# Patient Record
Sex: Female | Born: 1955 | Race: White | Hispanic: No | Marital: Married | Smoking: Former smoker
Health system: Southern US, Community
[De-identification: ages and names within clinical notes are randomized; demographics above are authoritative.]

## PROBLEM LIST (undated history)

## (undated) DIAGNOSIS — E109 Type 1 diabetes mellitus without complications: Secondary | ICD-10-CM

## (undated) DIAGNOSIS — J302 Other seasonal allergic rhinitis: Secondary | ICD-10-CM

## (undated) DIAGNOSIS — M329 Systemic lupus erythematosus, unspecified: Secondary | ICD-10-CM

## (undated) DIAGNOSIS — E039 Hypothyroidism, unspecified: Secondary | ICD-10-CM

## (undated) DIAGNOSIS — E785 Hyperlipidemia, unspecified: Secondary | ICD-10-CM

## (undated) DIAGNOSIS — I10 Essential (primary) hypertension: Secondary | ICD-10-CM

## (undated) DIAGNOSIS — K219 Gastro-esophageal reflux disease without esophagitis: Secondary | ICD-10-CM

## (undated) DIAGNOSIS — K589 Irritable bowel syndrome without diarrhea: Secondary | ICD-10-CM

## (undated) DIAGNOSIS — E119 Type 2 diabetes mellitus without complications: Secondary | ICD-10-CM

## (undated) HISTORY — DX: Hyperlipidemia, unspecified: E78.5

## (undated) HISTORY — DX: Gastro-esophageal reflux disease without esophagitis: K21.9

## (undated) HISTORY — DX: Irritable bowel syndrome without diarrhea: K58.9

## (undated) HISTORY — DX: Systemic lupus erythematosus, unspecified: M32.9

## (undated) HISTORY — DX: Type 1 diabetes mellitus without complications: E10.9

## (undated) HISTORY — DX: Other seasonal allergic rhinitis: J30.2

## (undated) HISTORY — DX: Hypothyroidism, unspecified: E03.9

## (undated) HISTORY — DX: Essential (primary) hypertension: I10

## (undated) HISTORY — DX: Type 2 diabetes mellitus without complications: E11.9

---

## 2000-04-03 ENCOUNTER — Inpatient Hospital Stay
Admission: EM | Admit: 2000-04-03 | Discharge status: Against Medical Advice | Payer: Self-pay | Source: Emergency Department | Admitting: Internal Medicine

## 2002-03-25 ENCOUNTER — Emergency Department
Admission: EM | Admit: 2002-03-25 | Disposition: A | Discharge status: Home / Self Care | Payer: Self-pay | Source: Ambulatory Visit

## 2003-07-15 ENCOUNTER — Ambulatory Visit: Admission: RE | Admit: 2003-07-15 | Payer: Self-pay | Source: Ambulatory Visit | Admitting: Gastroenterology

## 2007-05-03 ENCOUNTER — Ambulatory Visit
Admit: 2007-05-03 | Disposition: A | Discharge status: Home / Self Care | Payer: Self-pay | Source: Ambulatory Visit | Admitting: Orthopaedic Surgery

## 2007-07-20 ENCOUNTER — Ambulatory Visit
Admit: 2007-07-20 | Disposition: A | Discharge status: Home / Self Care | Payer: Self-pay | Source: Ambulatory Visit | Admitting: Orthopaedic Surgery

## 2007-07-20 LAB — CBC- CERNER
Hematocrit: 36.5 % — ABNORMAL LOW (ref 37.0–47.0)
Hgb: 12.4 G/DL (ref 12.0–16.0)
MCH: 29.6 PG (ref 28.0–32.0)
MCHC: 34 G/DL (ref 32.0–36.0)
MCV: 86.9 FL (ref 80.0–100.0)
MPV: 7.6 FL (ref 7.4–10.4)
Platelets: 344 /mm3 (ref 140–400)
RBC: 4.21 /mm3 (ref 4.20–5.40)
RDW: 13.3 % (ref 11.5–15.0)
WBC: 6.2 /mm3 (ref 3.5–10.8)

## 2007-07-20 LAB — BASIC METABOLIC PANEL
BUN: 18 MG/DL (ref 7–21)
CO2: 28 MEQ/L (ref 22–31)
Calcium: 9.5 MG/DL (ref 8.6–10.2)
Chloride: 104 MEQ/L (ref 98–107)
Creatinine: 0.7 MG/DL (ref 0.5–1.4)
Glucose: 68 MG/DL (ref 65–110)
Potassium: 4.8 MEQ/L (ref 3.6–5.0)
Sodium: 140 MEQ/L (ref 136–143)

## 2007-07-20 LAB — GFR

## 2007-07-31 ENCOUNTER — Ambulatory Visit: Admission: RE | Admit: 2007-07-31 | Payer: Self-pay | Source: Ambulatory Visit | Admitting: Orthopaedic Surgery

## 2008-01-13 ENCOUNTER — Emergency Department
Admission: EM | Admit: 2008-01-13 | Discharge status: Against Medical Advice | Payer: Self-pay | Source: Ambulatory Visit

## 2008-01-14 ENCOUNTER — Emergency Department
Admission: EM | Admit: 2008-01-14 | Disposition: A | Discharge status: Home / Self Care | Payer: Self-pay | Source: Ambulatory Visit

## 2009-03-14 ENCOUNTER — Ambulatory Visit
Admission: RE | Admit: 2009-03-14 | Disposition: A | Discharge status: Home / Self Care | Payer: Self-pay | Source: Ambulatory Visit | Admitting: Specialist

## 2010-11-30 ENCOUNTER — Ambulatory Visit: Payer: Self-pay

## 2010-11-30 ENCOUNTER — Ambulatory Visit: Admission: RE | Admit: 2010-11-30 | Payer: Self-pay | Source: Ambulatory Visit | Admitting: Surgery

## 2010-12-01 LAB — LAB USE ONLY - HISTORICAL SURGICAL PATHOLOGY

## 2011-05-03 ENCOUNTER — Ambulatory Visit
Admission: RE | Admit: 2011-05-03 | Discharge: 2011-05-03 | Disposition: A | Discharge status: Home / Self Care | Payer: Self-pay | Source: Ambulatory Visit | Attending: Gastroenterology | Admitting: Gastroenterology

## 2011-08-04 LAB — ECG 12-LEAD
Atrial Rate: 63 {beats}/min
P Axis: 69 degrees
P-R Interval: 160 ms
Q-T Interval: 386 ms
QRS Duration: 84 ms
QTC Calculation (Bezet): 395 ms
R Axis: 7 degrees
T Axis: 58 degrees
Ventricular Rate: 63 {beats}/min

## 2011-08-12 NOTE — Op Note (Signed)
ROOM NUMBER:                           OPS OPS 07            SURGEON:                                Cherlynn Polo, MD            DATE:                                   07/31/2007                  ASSISTANT:  Harvin Hazel, PA-C            PREOPERATIVE DIAGNOSIS:  Right knee anterior cruciate ligament tear.            POSTOPERATIVE DIAGNOSES:      1.   Right knee anterior cruciate ligament tear.      2.   Lateral meniscal tear.      3.   Chondromalacia of lateral femoral condyle.            OPERATION:      1.   Right anterior cruciate ligament reconstruction.      2.   Lateral meniscal repair.      3.   Chondromalacia of the lateral femoral condyle.            ANESTHESIA:  General.            INDICATIONS:  The patient is a 55 year old lady who had an injury to her      knee in June.  She was treated conservatively and had continued      instability.  An MRI showed that she had an ACL tear and possible medial      collateral ligament tear.  She had clinical instability despite      conservative management.  She understands risks of the surgery including      infection, nerve injury, blood clots, fracture-dislocation, laxity and      progression of arthritic changes and elected to proceed.            FINDINGS:  A complete tear of the ACL, a posterior horn lateral meniscal      tear on the red-on-red zone, intact medial malleolus, intact medial      compartment, intact patellofemoral compartment, grade 3 chondromalacia on      the small portion of the lateral femoral condyle.  The first assistant, my      assistant prepared the graft.  He also positioned the knee during the      meniscal repair and chondroplasty.            PROCEDURE:  The patient was brought to the operating room and after general      anesthesia was obtained, the patient was examined under anesthesia showing      to have pivot shift positive, anterior drawer positive for an ACL tear.  At      this point, the patient was prepped and draped in  a sterile fashion.            Two scope port holes were made on either side of the patellar tendon and      one  along the joint line using an #11 blade knife.  An outflow tract was      established in the medial suprapatellar pouch.  The scope was introduced      into the patellofemoral joint.  The patellofemoral joint showed normal      tracking.  The medial compartment was then entered showing intact medial      meniscus and articular surfaces.  The intracondylar notch had a      full-thickness ACL tear with an intact PCL. Lateral meniscus showed a      posterior horn lateral meniscal tear on the red-on-red zone vertical tear.      At this point, it was shaved back but still remained stable.  Bleeding      tissue was obtained at this point using a FasT-fix suture repair system.  A      FasT-fix anchor was placed and a horizontal mattress was placed on the      undersurface of the mesentery.  At this point, the string was tightened and      then cut.  This repaired the meniscus nicely.  At this point, the remnant      of the ACL was debrided back.  My assistant then prepared the ACL graft      first by reconstituting it appropriately and then fashioning it to fit 80 x      10 mm tunnel.  Two FiberWires were placed on either side.  During this      time, a notchplasty was performed on the lateral femoral condyle.  A 10-mm      retrograde drill was then placed in the footprint of the previous ACL.  A      skin nick was made distally and a guidepin was placed up in a retrograde      fashion into the joint.  A 10 x 30 mm socket was then drilled.  A      FiberStick was then placed through the cannulated drill guide and taken out      through the medial port hole.  The drill apparatus was then removed.  A      femoral tunnel guide was placed at approximately the 10:30 position with      7-mm guide.  A Beath needle was then placed through the lateral femoral      condyle and out the lateral aspect of the thigh.  An acorn  reamer was then      used to make a 10 x 30-mm tunnel.  At this point, all debris was removed      from the joint.  The graft was attached with the Beath needle and pulled      through the joint.  The FiberStick FiberWire was then used to pull the      suture through into the tibial tunnel.  The guidewire was placed into the      femoral tunnel and a 9 x 23 mm screw was placed.  After this was done, a      guidewire was then placed up the cannulated drill bit and the tibia and a      cannulated screwdriver was placed over the guidewire and the guidewire was      removed. The FiberStick was placed up through the cannulated screwdriver.      A 9 x 20 mm screw was placed, however, the screwdriver adhered to the screw      and had to be  removed.  A 10 x 20 mm screw was then placed in a similar      fashion and screwed into bone showing excellent purchase and excellent      tightness of the graft.  The graft was taken through a full range of motion      showing full extension and no impingement.  Vigorous irrigation was      performed.  Scoping instruments were removed.  Scoping port holes were      closed with 3-0 nylon.  A sterile dressing was placed.  The patient was      taken to the recovery room in stable condition.                                    Electronic Signing MD: Cherlynn Polo, MD            QQV:[id number]      Dictated:    07/31/2007  4:41 P      Transcribed: 08/01/2007 12:11 P      Job Number: [id number]      Document Number: [id number]            CC:  Cherlynn Polo, MD

## 2011-08-25 NOTE — Op Note (Signed)
Kellie Fuentes, Kellie Fuentes      MRN:          [id number]      Account:      [id number]      Document ID:  [id number]      Procedure Date: 11/30/2010            Admit Date: 11/30/2010            Patient Location: DISCHARGED 11/30/2010      Patient Type: A            SURGEON: Lorenza Chick MD      ASSISTANT:                  FIRST ASSISTANT:      Orvil Feil.            PREOPERATIVE DIAGNOSIS:      Lipoma, right arm.            POSTOPERATIVE DIAGNOSIS:      Lipoma, right arm.            TITLE OF PROCEDURE:      Excision of lipoma.            INDICATIONS:      This patient presented with a palpable subcutaneous nodule on her right      arm, and after preoperative evaluation and discussion, she wished to have      this removed.            FINDINGS:      A 2.5-cm lipoma.            DESCRIPTION OF PROCEDURE:      The patient was prepped and draped in the usual sterile fashion.  A      surgical pause was performed.  Marcaine 0.25% was used for local      anesthesia.  Antibiotics and SCDs were not indicated.  An incision was made      in the skin line over the nodule located on the right upper arm.  This was      taken down sharply and the lipoma was identified.  This was dissected out      bluntly and removed.  It measured approximately 2.5 cm.  After ensuring      satisfactory hemostasis, the wound was closed with interrupted 3-0 Vicryl.      Benzoin and Steri-Strips were applied.  The patient tolerated the procedure      well and was taken to the recovery room in stable condition.                        Electronic Signing Provider            D:  11/30/2010 08:44 AM by Dr. Lorenza Chick, MD [phone])                                   Page 1 of 2      VIVIKA, POYTHRESS      MRN:          [id number]      Account:      [id number]      Document ID:  [id number]      Procedure Date: 11/30/2010            T:  11/30/2010 12:39 PM by [id number]  cc:                                   Page 2 of 2      Authenticated by Lorenza Chick, MD [phone]) On 12/14/2010 10:29:54 AM

## 2012-02-23 ENCOUNTER — Other Ambulatory Visit: Payer: Self-pay | Admitting: Rheumatology

## 2012-02-23 ENCOUNTER — Ambulatory Visit
Admission: RE | Admit: 2012-02-23 | Discharge: 2012-02-23 | Disposition: A | Discharge status: Home / Self Care | Payer: Enrolled Prime—HMO | Source: Ambulatory Visit | Attending: Rheumatology | Admitting: Rheumatology

## 2012-02-23 DIAGNOSIS — M359 Systemic involvement of connective tissue, unspecified: Secondary | ICD-10-CM | POA: Insufficient documentation

## 2012-10-17 NOTE — Op Note (Unsigned)
DATE OF BIRTH:                        [birth year]      ADMISSION DATE:                     07/15/2003            PATIENT LOCATION:                     END END 01            DATE OF PROCEDURE:                   07/15/2003      SURGEON:                            Rayna Sexton, MD      ASSISTANT(S):                  PREOPERATIVE DIAGNOSIS:  CHANGE IN BOWEL HABITS AND RECTAL BLEEDING.            POSTOPERATIVE DIAGNOSES      1.   DIMINUTIVE COLON POLYPS.      2.   DIVERTICULOSIS COLI.            PROCEDURE:  COLONOSCOPY WITH BIOPSY.            DESCRIPTION OF PROCEDURE:  The procedure of colonoscopy and the possible      risks of bleeding and perforation requiring surgery and hospitalization had      been  discussed with the patient in my office and an informed consent was      signed.            Conscious sedation was administered with fentanyl 150 mcg IV and Versed 8      mg IV in divided doses and the patient was carefully monitored throughout      the procedure.  The Olympus video colonoscope was inserted into the rectum      and advanced without difficulty to the cecum which was documented by      identification of both the appendiceal orifice and the ileocecal valve.      Preparation of the colon was good.  The scope was slowly withdrawn from the      colon, the inner lining was well examined.  It was normal to the left colon      where a few scattered diverticula sac openings were noted.  In the rectum,      was a small size polyp removed with biopsy forceps and sent for pathology.      No significant hemorrhoids were seen.  She tolerated the procedure well,      was sent to the recovery room in good condition.                                    ___________________________________          Date Signed: __________      Rayna Sexton, MD  ([id number])            D: 07/15/2003 by Rayna Sexton, MD      T: 07/15/2003 by [id number] ([id number]) ([id number])      cc:  Lenord Fellers, MD  Shireen Quan, MD

## 2013-05-09 ENCOUNTER — Encounter: Payer: Self-pay | Admitting: Internal Medicine

## 2013-05-09 ENCOUNTER — Ambulatory Visit: Payer: Commercial Managed Care - POS | Admitting: Internal Medicine

## 2013-05-09 VITALS — BP 150/73 | HR 65 | Temp 98.8°F | Ht 63.0 in | Wt 179.6 lb

## 2013-05-09 NOTE — Progress Notes (Signed)
Subjective:       Patient ID: Kellie Fuentes is a 57 y.o. female.    HPI    The patient presents to reestablish in my office. She is a former patient who left for insurance reasons many years ago. Her most recent physician is no longer available.    She has a history of SLE and sees a rheumatologist as needed. There have been no recent flares.    She has a history of hypothyroidism and has been euthyroid on replacement.    She has a history of type 2 diabetes, also recently controlled.    She has a history of hypertension. She has run out of her medication.    She has a history of sleep issues and uses Xanax and trazodone as needed.    She underwent exploratory laparoscopy earlier this year for suspected gynecologic abnormalities but nothing worrisome was found.    There are no acute issues today.    Review of Systems   Constitutional: Negative for fever, chills, diaphoresis, activity change, appetite change, fatigue and unexpected weight change.   HENT: Negative.    Eyes: Negative for visual disturbance.   Respiratory: Negative for cough and shortness of breath.    Cardiovascular: Negative for chest pain and palpitations.   Gastrointestinal: Negative for abdominal pain.   All other systems reviewed and are negative.            Objective:    Physical Exam   Vitals reviewed.  Constitutional: She appears well-developed and well-nourished. No distress.   HENT:   Head: Normocephalic and atraumatic.   Mouth/Throat: Oropharynx is clear and moist.   Eyes: Conjunctivae normal are normal.   Neck: Normal range of motion. Neck supple.   Cardiovascular: Normal rate and regular rhythm.    Pulmonary/Chest: Effort normal and breath sounds normal.           Assessment:       SLE.  Diabetes mellitus.  Hypothyroidism.  Hypertension.  Sleep issues.  Allergies.        Plan:       The patient will have lab work to monitor her problems and medications. I will refill her medications. She will return at the end of the year for  comprehensive medical evaluation. She will call for problems in the interim.

## 2013-05-10 LAB — COMPREHENSIVE METABOLIC PANEL
ALT: 31 U/L — ABNORMAL HIGH (ref 6–29)
AST (SGOT): 14 U/L (ref 10–35)
Albumin/Globulin Ratio: 1.5 (ref 1.0–2.5)
Albumin: 4.4 G/DL (ref 3.6–5.1)
Alkaline Phosphatase: 79 U/L (ref 33–130)
BUN: 12 MG/DL (ref 7–25)
Bilirubin, Total: 0.5 MG/DL (ref 0.2–1.2)
CO2: 26 mmol/L (ref 19–30)
Calcium: 10 MG/DL (ref 8.6–10.4)
Chloride: 105 mmol/L (ref 98–110)
Creatinine: 0.58 mg/dL (ref 0.50–1.05)
EGFR African American: 117 mL/min/{1.73_m2} (ref >=60)
EGFR: 101 mL/min/{1.73_m2} (ref >=60)
Globulin: 3 G/DL (ref 1.9–3.7)
Glucose: 90 MG/DL (ref 65–99)
Potassium: 4.7 mmol/L (ref 3.5–5.3)
Protein, Total: 7.4 G/DL (ref 6.1–8.1)
Sodium: 139 mmol/L (ref 135–146)

## 2013-05-10 LAB — TSH: TSH: 1.62 mIU/L (ref 0.40–4.50)

## 2013-05-10 LAB — CBC AND DIFFERENTIAL
Atypical Lymphocytes %: 0 %
Baso(Absolute): 11 cells/uL (ref 0–200)
Basophils: 0.2 %
Eosinophils Absolute: 90 cells/uL (ref 15–500)
Eosinophils: 1.7 %
Hematocrit: 40.2 % (ref 35.0–45.0)
Hemoglobin: 13.3 g/dL (ref 11.7–15.5)
Lymphocytes Absolute: 853 cells/uL (ref 850–3900)
Lymphocytes: 16.1 %
MCH: 29.9 pg (ref 27–33)
MCHC: 33.1 g/dL (ref 32–36)
MCV: 90 fL (ref 80–100)
MPV: 8.7 fL (ref 7.5–11.5)
Monocytes Absolute: 429 cells/uL (ref 200–950)
Monocytes: 8.1 %
Neutrophils Absolute: 3917 cells/uL (ref 1500–7800)
Neutrophils: 73.9 %
Platelets: 285 10*3/uL (ref 140–400)
RBC: 4.46 10*6/uL (ref 3.80–5.10)
RDW: 13.4 % (ref 11.0–15.0)
WBC: 5.3 10*3/uL (ref 3.8–10.8)

## 2013-05-10 LAB — LIPID PANEL
Cholesterol / HDL Ratio: 3.4 (ref 0.0–5.0)
Cholesterol: 173 MG/DL (ref 125–200)
HDL: 51 mg/dL (ref >=46)
LDL Calculated: 104 mg/dL (ref <130)
Non HDL Cholesterol (LDL and VLDL): 122 mg/dL
Triglycerides: 91 MG/DL (ref <150)

## 2013-05-10 LAB — CK: Creatinine Kinase, Total: 25 U/L — ABNORMAL LOW (ref 29–143)

## 2013-05-10 LAB — T4, FREE: T4 Free: 1.1 ng/dL (ref 0.8–1.8)

## 2013-05-10 LAB — HEMOGLOBIN A1C: Hemoglobin A1C: 7.4 % — ABNORMAL HIGH (ref <5.7)

## 2013-05-11 ENCOUNTER — Telehealth: Payer: Self-pay | Admitting: Internal Medicine

## 2013-05-11 NOTE — Telephone Encounter (Signed)
The patient was called and her lab work was discussed. I recommended she come back to see me with blood sugar monitoring so that we can improve her diabetic control.

## 2013-05-18 ENCOUNTER — Other Ambulatory Visit: Payer: Self-pay

## 2013-05-18 MED ORDER — GLIMEPIRIDE 4 MG PO TABS
4.0000 mg | ORAL_TABLET | Freq: Two times a day (BID) | ORAL | Status: DC
Start: 2013-05-18 — End: 2014-07-28

## 2013-05-18 MED ORDER — GLUCOSE BLOOD VI STRP
1.0000 | ORAL_STRIP | Freq: Three times a day (TID) | Status: DC | PRN
Start: 2013-05-18 — End: 2013-08-23

## 2013-05-18 MED ORDER — METFORMIN HCL ER 500 MG PO TB24
500.0000 mg | ORAL_TABLET | Freq: Three times a day (TID) | ORAL | Status: DC
Start: 2013-05-18 — End: 2013-10-10

## 2013-05-18 MED ORDER — VALACYCLOVIR HCL 500 MG PO TABS
500.0000 mg | ORAL_TABLET | Freq: Every day | ORAL | Status: DC
Start: 2013-05-18 — End: 2013-10-10

## 2013-05-18 MED ORDER — GLUCOSE BLOOD VI STRP
1.0000 | ORAL_STRIP | Freq: Three times a day (TID) | Status: DC | PRN
Start: 2013-05-18 — End: 2013-05-18

## 2013-05-18 MED ORDER — INSULIN GLARGINE 100 UNIT/ML SC SOPN
60.0000 [IU] | PEN_INJECTOR | Freq: Every day | SUBCUTANEOUS | Status: DC
Start: 2013-05-18 — End: 2014-01-09

## 2013-05-18 MED ORDER — LEVOTHYROXINE SODIUM 75 MCG PO TABS
75.0000 ug | ORAL_TABLET | Freq: Every day | ORAL | Status: DC
Start: 2013-05-18 — End: 2013-11-06

## 2013-05-18 MED ORDER — ALPRAZOLAM 0.5 MG PO TABS
1.0000 mg | ORAL_TABLET | Freq: Every evening | ORAL | Status: DC | PRN
Start: 2013-05-18 — End: 2013-08-09

## 2013-05-18 MED ORDER — LOVASTATIN 20 MG PO TABS
20.0000 mg | ORAL_TABLET | Freq: Every evening | ORAL | Status: DC
Start: 2013-05-18 — End: 2014-03-08

## 2013-05-18 NOTE — Telephone Encounter (Signed)
Alprazolam called in to patient's pharmacy.

## 2013-08-09 ENCOUNTER — Other Ambulatory Visit: Payer: Self-pay

## 2013-08-09 MED ORDER — ALPRAZOLAM 0.5 MG PO TABS
1.0000 mg | ORAL_TABLET | Freq: Every evening | ORAL | Status: DC | PRN
Start: 2013-08-09 — End: 2013-10-10

## 2013-08-09 NOTE — Telephone Encounter (Signed)
Rx called in to patient's pharmacy.

## 2013-08-23 ENCOUNTER — Other Ambulatory Visit: Payer: Self-pay

## 2013-08-23 MED ORDER — GLUCOSE BLOOD VI STRP
1.0000 | ORAL_STRIP | Freq: Three times a day (TID) | Status: DC | PRN
Start: 2013-08-23 — End: 2016-09-24

## 2013-09-07 ENCOUNTER — Telehealth: Payer: Self-pay

## 2013-09-07 ENCOUNTER — Other Ambulatory Visit: Payer: Self-pay

## 2013-09-07 NOTE — Telephone Encounter (Signed)
Left message for patient that Dr. Einar Crow wants her to come in for CPE and/or f/u before he will approve a refill of Alprazolam.

## 2013-09-17 ENCOUNTER — Other Ambulatory Visit: Payer: Self-pay | Admitting: Internal Medicine

## 2013-10-03 ENCOUNTER — Other Ambulatory Visit: Payer: Self-pay

## 2013-10-03 NOTE — Telephone Encounter (Signed)
If you approve this, this rx can be called in.

## 2013-10-05 NOTE — Telephone Encounter (Signed)
Dr. Einar Crow said he would give her enough until her appt. #10 called in to patient's preferred pharmacy.

## 2013-10-05 NOTE — Telephone Encounter (Signed)
Her CPE is scheduled for 12/17.

## 2013-10-10 ENCOUNTER — Encounter: Payer: Self-pay | Admitting: Internal Medicine

## 2013-10-10 ENCOUNTER — Ambulatory Visit: Payer: Commercial Managed Care - POS | Admitting: Internal Medicine

## 2013-10-10 DIAGNOSIS — Z79899 Other long term (current) drug therapy: Secondary | ICD-10-CM

## 2013-10-10 DIAGNOSIS — G479 Sleep disorder, unspecified: Secondary | ICD-10-CM

## 2013-10-10 DIAGNOSIS — M1990 Unspecified osteoarthritis, unspecified site: Secondary | ICD-10-CM

## 2013-10-10 DIAGNOSIS — E039 Hypothyroidism, unspecified: Secondary | ICD-10-CM

## 2013-10-10 DIAGNOSIS — Z0000 Encounter for general adult medical examination without abnormal findings: Secondary | ICD-10-CM

## 2013-10-10 DIAGNOSIS — E119 Type 2 diabetes mellitus without complications: Secondary | ICD-10-CM

## 2013-10-10 DIAGNOSIS — Z23 Encounter for immunization: Secondary | ICD-10-CM

## 2013-10-10 DIAGNOSIS — E785 Hyperlipidemia, unspecified: Secondary | ICD-10-CM

## 2013-10-10 MED ORDER — ALPRAZOLAM 0.5 MG PO TBDP
0.5000 mg | ORAL_TABLET | Freq: Every evening | ORAL | Status: DC | PRN
Start: 2013-10-10 — End: 2013-11-19

## 2013-10-10 MED ORDER — IBUPROFEN 800 MG PO TABS
800.0000 mg | ORAL_TABLET | Freq: Four times a day (QID) | ORAL | Status: DC | PRN
Start: 2013-10-10 — End: 2014-03-19

## 2013-10-10 MED ORDER — IRBESARTAN 75 MG PO TABS
75.0000 mg | ORAL_TABLET | Freq: Every evening | ORAL | Status: DC
Start: 2013-10-10 — End: 2014-01-14

## 2013-10-10 NOTE — Progress Notes (Signed)
Attempted to speak with patient's pharmacist. The pharmacy was closed for lunch. Left message stating that patient needs refill of Solostar Lantus pen needles #2 boxes with 1 refill.

## 2013-10-10 NOTE — Progress Notes (Signed)
Subjective:       Patient ID: Kellie Fuentes is a 57 y.o. female.    HPI    The patient presents for comprehensive followup of her medical problems. She has a history of diabetes mellitus which requires both Lantus insulin and oral agents. She denies polyuria, polydipsia, or polyphagia. Her ophthalmologic care is up-to-date. Her sugars have been reasonable.    The patient has a history of lupus and is taking Plaquenil under the direction of her rheumatologist. She also uses Motrin. She gets eye examinations every 6 months.    The patient has a history of hypertension and hyperlipidemia both of which are reasonably controlled on medication. This needs followup today.    The patient has a history of hypothyroidism and is on replacement.    The patient has a history of seasonal allergies.    The patient has chronic sleeping issues. She generally falls asleep without difficulty but awakens around 2 AM. Trazodone was not helpful but she has been taking Xanax every evening to address this.    The patient also has a chronic history of blood in her first morning mucus when she wakes up. It is associated with some throat irritation. She has undergone pulmonary and ENT evaluation but has never had full visualization of her upper airway.    Her colonoscopy is up-to-date. She has had a flu shot but is not up-to-date on her other vaccines. Her gynecologic care and mammograms are up-to-date.    She is married with stepchildren. She is employed. There are no new personal issues.    Review of Systems   Constitutional: Negative for fever, chills, diaphoresis, activity change, appetite change, fatigue and unexpected weight change.   HENT: Negative.    Eyes: Negative for visual disturbance.   Respiratory: Negative for cough, shortness of breath, wheezing and stridor.    Cardiovascular: Negative for chest pain, palpitations and leg swelling.   Gastrointestinal: Negative for nausea, abdominal pain, diarrhea, constipation and blood in  stool.   Genitourinary: Negative for difficulty urinating and pelvic pain.   Musculoskeletal: Positive for arthralgias and back pain.   Neurological: Negative.    All other systems reviewed and are negative.            Objective:    Physical Exam   Vitals reviewed.  Constitutional: She is oriented to person, place, and time. She appears well-developed and well-nourished. No distress.        Blood pressure was 138/92 in the left arm manually.   HENT:   Head: Normocephalic and atraumatic.   Right Ear: External ear normal.   Left Ear: External ear normal.   Mouth/Throat: Oropharynx is clear and moist.   Eyes: Conjunctivae normal and EOM are normal. Pupils are equal, round, and reactive to light.   Neck: Normal range of motion. Neck supple. No thyromegaly present.   Cardiovascular: Normal rate, regular rhythm, normal heart sounds and intact distal pulses.    Pulmonary/Chest: Effort normal and breath sounds normal. She has no wheezes. She has no rales.   Abdominal: Soft. Bowel sounds are normal. She exhibits no mass. There is no tenderness.   Genitourinary:        Breast examination revealed no masses, no discharge, no skin changes, no nipple retraction, and no adenopathy.   Musculoskeletal: Normal range of motion. She exhibits no edema and no tenderness.        She has mild-to-moderate osteoarthritic changes of both knees   Lymphadenopathy:  She has no cervical adenopathy.   Neurological: She is alert and oriented to person, place, and time. She has normal reflexes. No cranial nerve deficit.   Skin: Skin is warm and dry.   Psychiatric: She has a normal mood and affect.           Assessment:       SLE.  Diabetes mellitus.  Hypothyroidism.  Hypertension.  Hyperlipidemia.  Sleep disturbance.  Bloody a.m. Secretions presumably from the throat.      Plan:       The patient will have an EKG and laboratory profile today. Her tetanus is updated today. I will provide an ENT referral to visualize her throat and airway. I  refilled her Xanax but emphasized that this is not a good long-term solution. We may consider sleep evaluation.    I will call the patient with test results and pursue further evaluation as appropriate.

## 2013-10-11 LAB — CBC AND DIFFERENTIAL
Atypical Lymphocytes %: 0 %
Baso(Absolute): 0 cells/uL (ref 0–200)
Basophils: 0 %
Eosinophils Absolute: 110 cells/uL (ref 15–500)
Eosinophils: 1.8 %
Hematocrit: 38 % (ref 35.0–45.0)
Hemoglobin: 12.5 g/dL (ref 11.7–15.5)
Lymphocytes Absolute: 647 cells/uL — ABNORMAL LOW (ref 850–3900)
Lymphocytes: 10.6 %
MCH: 29.9 pg (ref 27–33)
MCHC: 32.8 g/dL (ref 32–36)
MCV: 91 fL (ref 80–100)
MPV: 8.7 fL (ref 7.5–11.5)
Monocytes Absolute: 427 cells/uL (ref 200–950)
Monocytes: 7 %
Neutrophils Absolute: 4917 cells/uL (ref 1500–7800)
Neutrophils: 80.6 %
Platelets: 256 10*3/uL (ref 140–400)
RBC: 4.17 10*6/uL (ref 3.80–5.10)
RDW: 13.4 % (ref 11.0–15.0)
WBC: 6.1 10*3/uL (ref 3.8–10.8)

## 2013-10-11 LAB — LIPID PANEL
Cholesterol / HDL Ratio: 2.8 (ref 0.0–5.0)
Cholesterol: 150 MG/DL (ref 125–200)
HDL: 54 mg/dL (ref >=46)
LDL Calculated: 76 mg/dL (ref <130)
Non HDL Cholesterol (LDL and VLDL): 96 mg/dL
Triglycerides: 98 MG/DL (ref <150)

## 2013-10-11 LAB — COMPREHENSIVE METABOLIC PANEL
ALT: 32 U/L — ABNORMAL HIGH (ref 6–29)
AST (SGOT): 18 U/L (ref 10–35)
Albumin/Globulin Ratio: 1.4 (ref 1.0–2.5)
Albumin: 4.2 G/DL (ref 3.6–5.1)
Alkaline Phosphatase: 59 U/L (ref 33–130)
BUN: 15 MG/DL (ref 7–25)
Bilirubin, Total: 0.7 MG/DL (ref 0.2–1.2)
CO2: 26 mmol/L (ref 19–30)
Calcium: 9.3 MG/DL (ref 8.6–10.4)
Chloride: 104 mmol/L (ref 98–110)
Creatinine: 0.65 mg/dL (ref 0.50–1.05)
EGFR African American: 113 mL/min/{1.73_m2} (ref >=60)
EGFR: 97 mL/min/{1.73_m2} (ref >=60)
Globulin: 2.9 G/DL (ref 1.9–3.7)
Glucose: 143 MG/DL — ABNORMAL HIGH (ref 65–99)
Potassium: 4.5 mmol/L (ref 3.5–5.3)
Protein, Total: 7.1 G/DL (ref 6.1–8.1)
Sodium: 139 mmol/L (ref 135–146)

## 2013-10-11 LAB — CK: Creatinine Kinase, Total: 26 U/L — ABNORMAL LOW (ref 29–143)

## 2013-10-11 LAB — TSH: TSH: 1.32 mIU/L (ref 0.40–4.50)

## 2013-10-11 LAB — T4, FREE: T4 Free: 1.2 ng/dL (ref 0.8–1.8)

## 2013-10-11 LAB — HEMOGLOBIN A1C: Hemoglobin A1C: 6.7 % — ABNORMAL HIGH (ref <5.7)

## 2013-10-25 ENCOUNTER — Telehealth: Payer: Self-pay | Admitting: Internal Medicine

## 2013-10-25 NOTE — Telephone Encounter (Signed)
The patient was called and all data was discussed. She has known hepatic steatosis. She has never had high risk exposures, blood transfusions, or use IV drugs. Routine followup in 6 months.

## 2013-11-06 ENCOUNTER — Other Ambulatory Visit: Payer: Self-pay

## 2013-11-06 MED ORDER — LEVOTHYROXINE SODIUM 75 MCG PO TABS
75.0000 ug | ORAL_TABLET | Freq: Every day | ORAL | Status: DC
Start: 2013-11-06 — End: 2014-02-05

## 2013-11-19 ENCOUNTER — Other Ambulatory Visit: Payer: Self-pay

## 2013-11-19 DIAGNOSIS — G479 Sleep disorder, unspecified: Secondary | ICD-10-CM

## 2013-11-28 MED ORDER — ALPRAZOLAM 0.5 MG PO TABS
0.5000 mg | ORAL_TABLET | Freq: Every evening | ORAL | Status: DC | PRN
Start: 2013-11-19 — End: 2014-01-09

## 2013-11-28 NOTE — Telephone Encounter (Signed)
Rx phoned in.   

## 2013-11-29 ENCOUNTER — Other Ambulatory Visit: Payer: Self-pay | Admitting: Internal Medicine

## 2014-01-09 ENCOUNTER — Encounter: Payer: Self-pay | Admitting: Internal Medicine

## 2014-01-09 ENCOUNTER — Ambulatory Visit: Payer: Commercial Managed Care - POS | Admitting: Internal Medicine

## 2014-01-09 VITALS — BP 142/75 | HR 75 | Temp 97.9°F | Ht 63.0 in | Wt 181.6 lb

## 2014-01-09 DIAGNOSIS — J329 Chronic sinusitis, unspecified: Secondary | ICD-10-CM

## 2014-01-09 DIAGNOSIS — G479 Sleep disorder, unspecified: Secondary | ICD-10-CM

## 2014-01-09 DIAGNOSIS — E119 Type 2 diabetes mellitus without complications: Secondary | ICD-10-CM

## 2014-01-09 MED ORDER — ALPRAZOLAM 0.5 MG PO TABS
0.5000 mg | ORAL_TABLET | Freq: Every evening | ORAL | Status: DC | PRN
Start: 2014-01-09 — End: 2014-03-20

## 2014-01-09 MED ORDER — INSULIN GLARGINE 100 UNIT/ML SC SOPN
60.0000 [IU] | PEN_INJECTOR | Freq: Every day | SUBCUTANEOUS | Status: DC
Start: 2014-01-09 — End: 2015-01-18

## 2014-01-09 MED ORDER — LEVOFLOXACIN 500 MG PO TABS
500.0000 mg | ORAL_TABLET | Freq: Every day | ORAL | Status: AC
Start: 2014-01-09 — End: 2014-01-16

## 2014-01-09 NOTE — Progress Notes (Signed)
Subjective:       Patient ID: Kellie Fuentes is a 58 y.o. female.    HPI    The patient presents with recurrence of sore throat, cough, and congestion. She had similar symptoms several months ago and also became aware that she was exposed to someone had a significant bacterial lung infection. She has recurrence of this morning cough with blood in her sputum. It does not occur the rest of the day. She describes chronic throat irritation and hoarseness. She has seen ear nose and throat physicians but has not reached closure on her symptoms.    The patient also requires a refill of Xanax, which she says she uses twice a week to treat insomnia.    Review of Systems   All other systems reviewed and are negative.            Objective:    Physical Exam   Vitals reviewed.  Constitutional: She appears well-developed and well-nourished. No distress.   HENT:   Head: Normocephalic and atraumatic.   Right Ear: External ear normal.   Left Ear: External ear normal.   Mouth/Throat: Oropharynx is clear and moist.        There is minimal oropharyngeal erythema. There is no exudate or ulceration   Eyes: Conjunctivae normal are normal.   Neck: Normal range of motion. Neck supple.   Cardiovascular: Normal rate and regular rhythm.    Pulmonary/Chest: Effort normal and breath sounds normal. She has no wheezes. She has no rales.   Lymphadenopathy:     She has no cervical adenopathy.           Assessment:       Respiratory symptoms as noted.  Chronic upper airway issues as noted.      Plan:       The patient was given Levaquin 500 milligrams daily for 10 days. She will hold her sulfonylurea and use Lantus insulin alone while taking the antibiotic to avoid drug interaction. I have ordered a chest x-ray and sinus series. I have given her a referral to a new ear nose and throat physician for independent evaluation.    I have refilled her Xanax but again emphasized that this is not an optimal long-term solution. I will try to encourage  formal sleep evaluation.

## 2014-01-09 NOTE — Progress Notes (Signed)
Patient states that she has been working in close contact with someone who was dx with a bacterial infection.

## 2014-01-14 ENCOUNTER — Other Ambulatory Visit: Payer: Self-pay | Admitting: Internal Medicine

## 2014-01-19 ENCOUNTER — Telehealth: Payer: Self-pay | Admitting: Internal Medicine

## 2014-01-19 NOTE — Telephone Encounter (Signed)
The patient was called and told her x-rays were normal.

## 2014-02-05 ENCOUNTER — Other Ambulatory Visit: Payer: Self-pay | Admitting: Internal Medicine

## 2014-03-08 ENCOUNTER — Other Ambulatory Visit: Payer: Self-pay | Admitting: Internal Medicine

## 2014-03-19 ENCOUNTER — Other Ambulatory Visit: Payer: Self-pay

## 2014-03-19 DIAGNOSIS — M1990 Unspecified osteoarthritis, unspecified site: Secondary | ICD-10-CM

## 2014-03-19 MED ORDER — IBUPROFEN 800 MG PO TABS
800.0000 mg | ORAL_TABLET | Freq: Four times a day (QID) | ORAL | Status: DC | PRN
Start: 2014-03-19 — End: 2014-05-06

## 2014-03-20 ENCOUNTER — Other Ambulatory Visit: Payer: Self-pay | Admitting: Internal Medicine

## 2014-04-02 ENCOUNTER — Ambulatory Visit: Payer: Commercial Managed Care - POS | Admitting: Internal Medicine

## 2014-04-02 ENCOUNTER — Encounter: Payer: Self-pay | Admitting: Internal Medicine

## 2014-04-02 VITALS — BP 129/80 | HR 93 | Temp 97.7°F | Ht 63.0 in | Wt 181.4 lb

## 2014-04-02 DIAGNOSIS — E119 Type 2 diabetes mellitus without complications: Secondary | ICD-10-CM

## 2014-04-02 DIAGNOSIS — Z79899 Other long term (current) drug therapy: Secondary | ICD-10-CM

## 2014-04-02 DIAGNOSIS — E785 Hyperlipidemia, unspecified: Secondary | ICD-10-CM

## 2014-04-02 DIAGNOSIS — Z01818 Encounter for other preprocedural examination: Secondary | ICD-10-CM

## 2014-04-02 NOTE — Progress Notes (Signed)
Subjective:       Patient ID: Kellie Fuentes is a 58 y.o. female.    HPI    The patient presents for medical follow-up in anticipation of upcoming gynecologic surgery.  She had recent pelvic examination which identified some vulvar lesions and she is scheduled for laser ablation of these abnormalities.    Current medications are listed separately.  ALLERGIES:  Penicillin.    Previous surgeries:  Remote history of breast surgery.  Remote history of knee surgery.  Laparoscopy.    Medical problems:  Diabetes mellitus.  This is managed with oral agents and Lantus insulin.  This has remained relatively stable.    Hypothyroidism.  She has been euthyroid on replacement and her thyroid-stimulating hormone in December was fine.    History of lupus.  She is managed on Plaquenil.  There have been primarily dermatologic manifestations without significant joint or other systemic issues.    Environmental ALLERGIES.  This is managed with medication.    Hypertension.  Hyperlipidemia.      Review of Systems   Constitutional: Negative for fever, chills, diaphoresis, activity change, appetite change, fatigue and unexpected weight change.   HENT: Negative.    Eyes: Negative for visual disturbance.   Respiratory: Negative for cough, shortness of breath, wheezing and stridor.    Cardiovascular: Negative for chest pain, palpitations and leg swelling.   Gastrointestinal: Negative for abdominal pain, diarrhea, constipation and blood in stool.   Endocrine: Negative.    Genitourinary: Negative.    Musculoskeletal: Negative.    Allergic/Immunologic: Negative.    Neurological: Negative.    All other systems reviewed and are negative.          Objective:    Physical Exam   Constitutional: She is oriented to person, place, and time.   HENT:   Head: Normocephalic and atraumatic.   Mouth/Throat: Oropharynx is clear and moist.   Eyes: Conjunctivae are normal.   Neck: Normal range of motion. Neck supple.   Cardiovascular: Normal rate, regular  rhythm, normal heart sounds and intact distal pulses.    Pulmonary/Chest: Effort normal and breath sounds normal. She has no wheezes. She has no rales.   Abdominal: Soft. Bowel sounds are normal. She exhibits no mass. There is no tenderness.   Musculoskeletal: Normal range of motion.   Lymphadenopathy:     She has no cervical adenopathy.   Neurological: She is alert and oriented to person, place, and time. She has normal reflexes. No cranial nerve deficit.   Skin: Skin is warm and dry.   Psychiatric: She has a normal mood and affect.            Assessment:       Vulvar lesions.  Diabetes mellitus.  Hypertension.  Hypothyroidism.  Environmental allergy.  Hyperlipidemia.  SLE.         Plan:      Procedures    The patient will have an EKG and laboratory profile. Assuming no worrisome abnormalities are found, she will be allowed to proceed with surgery. Her risk profile is acceptable.    Her medications should be continued throughout the perioperative period apart from NSAIDs. Her metformin and Amaryl should be stopped the morning of surgery and not restarted until she is eating normally. The patient has been made aware of this.    Regarding her Lantus insulin, she normally takes 47 units in the evening. The plan is to cut her back to 15 units on the night before surgery. I  have been told that surgery is in the morning and she should be eating by early afternoon.    If questions or problems arise, please call me at my office.                                                                                                        Oswin Johal,M.D.

## 2014-04-02 NOTE — Progress Notes (Signed)
Patient scheduled for vulvar lesion laser ablation with Dr. Ortencia Kick on 04/19/2014.

## 2014-04-03 LAB — CBC AND DIFFERENTIAL
Atypical Lymphocytes %: 0 %
Baso(Absolute): 22 cells/uL (ref 0–200)
Basophils: 0.4 %
Eosinophils Absolute: 213 cells/uL (ref 15–500)
Eosinophils: 3.8 %
Hematocrit: 37.5 % (ref 35.0–45.0)
Hemoglobin: 12.4 g/dL (ref 11.7–15.5)
Lymphocytes Absolute: 801 cells/uL — ABNORMAL LOW (ref 850–3900)
Lymphocytes: 14.3 %
MCH: 30.4 pg (ref 27–33)
MCHC: 33 g/dL (ref 32–36)
MCV: 92 fL (ref 80–100)
MPV: 8.3 fL (ref 7.5–11.5)
Monocytes Absolute: 437 cells/uL (ref 200–950)
Monocytes: 7.8 %
Neutrophils Absolute: 4127 cells/uL (ref 1500–7800)
Neutrophils: 73.7 %
Platelets: 322 10*3/uL (ref 140–400)
RBC: 4.08 10*6/uL (ref 3.80–5.10)
RDW: 14.8 % (ref 11.0–15.0)
WBC: 5.6 10*3/uL (ref 3.8–10.8)

## 2014-04-03 LAB — COMPREHENSIVE METABOLIC PANEL
ALT: 18 U/L (ref 6–29)
AST (SGOT): 13 U/L (ref 10–35)
Albumin/Globulin Ratio: 1.7 (ref 1.0–2.5)
Albumin: 4.4 G/DL (ref 3.6–5.1)
Alkaline Phosphatase: 65 U/L (ref 33–130)
BUN: 18 MG/DL (ref 7–25)
Bilirubin, Total: 0.5 MG/DL (ref 0.2–1.2)
CO2: 26 mmol/L (ref 19–30)
Calcium: 9.5 MG/DL (ref 8.6–10.4)
Chloride: 105 mmol/L (ref 98–110)
Creatinine: 0.63 mg/dL (ref 0.50–0.99)
EGFR African American: 113 mL/min/{1.73_m2} (ref >=60)
EGFR: 97 mL/min/{1.73_m2} (ref >=60)
Globulin: 2.6 G/DL (ref 1.9–3.7)
Glucose: 124 MG/DL — ABNORMAL HIGH (ref 65–99)
Potassium: 4.2 mmol/L (ref 3.5–5.3)
Protein, Total: 7 G/DL (ref 6.1–8.1)
Sodium: 140 mmol/L (ref 135–146)

## 2014-04-03 LAB — LIPID PANEL
Cholesterol / HDL Ratio: 3.4 (ref 0.0–5.0)
Cholesterol: 178 MG/DL (ref 125–200)
HDL: 52 mg/dL (ref >=46)
LDL Calculated: 110 mg/dL (ref <130)
Non HDL Cholesterol (LDL and VLDL): 126 mg/dL
Triglycerides: 80 MG/DL (ref <150)

## 2014-04-03 LAB — CK: Creatinine Kinase, Total: 33 U/L (ref 29–143)

## 2014-04-03 LAB — HEMOGLOBIN A1C: Hemoglobin A1C: 6.5 % — ABNORMAL HIGH (ref <5.7)

## 2014-04-13 ENCOUNTER — Other Ambulatory Visit: Payer: Self-pay | Admitting: Internal Medicine

## 2014-04-15 ENCOUNTER — Other Ambulatory Visit: Payer: Self-pay

## 2014-04-15 MED ORDER — ALPRAZOLAM 0.5 MG PO TABS
0.5000 mg | ORAL_TABLET | Freq: Every evening | ORAL | Status: DC | PRN
Start: 2014-04-15 — End: 2014-07-23

## 2014-04-15 NOTE — Telephone Encounter (Signed)
Rx faxed

## 2014-05-06 ENCOUNTER — Other Ambulatory Visit: Payer: Self-pay

## 2014-05-06 DIAGNOSIS — M1990 Unspecified osteoarthritis, unspecified site: Secondary | ICD-10-CM

## 2014-05-06 MED ORDER — IBUPROFEN 800 MG PO TABS
800.0000 mg | ORAL_TABLET | Freq: Four times a day (QID) | ORAL | Status: DC | PRN
Start: 2014-05-06 — End: 2014-10-24

## 2014-05-11 ENCOUNTER — Other Ambulatory Visit: Payer: Self-pay | Admitting: Internal Medicine

## 2014-07-02 ENCOUNTER — Other Ambulatory Visit: Payer: Self-pay | Admitting: Internal Medicine

## 2014-07-07 ENCOUNTER — Other Ambulatory Visit: Payer: Self-pay | Admitting: Internal Medicine

## 2014-07-23 ENCOUNTER — Encounter: Payer: Self-pay | Admitting: Internal Medicine

## 2014-07-23 ENCOUNTER — Telehealth: Payer: Self-pay | Admitting: Internal Medicine

## 2014-07-23 ENCOUNTER — Ambulatory Visit: Payer: Commercial Managed Care - POS | Admitting: Internal Medicine

## 2014-07-23 VITALS — BP 133/78 | HR 73 | Temp 98.6°F | Ht 63.0 in | Wt 179.0 lb

## 2014-07-23 DIAGNOSIS — G4720 Circadian rhythm sleep disorder, unspecified type: Secondary | ICD-10-CM

## 2014-07-23 DIAGNOSIS — R52 Pain, unspecified: Secondary | ICD-10-CM

## 2014-07-23 MED ORDER — ALPRAZOLAM 0.5 MG PO TABS
0.5000 mg | ORAL_TABLET | Freq: Every evening | ORAL | Status: DC | PRN
Start: 2014-07-23 — End: 2014-08-12

## 2014-07-23 MED ORDER — OXYCODONE-ACETAMINOPHEN 5-325 MG PO TABS
1.0000 | ORAL_TABLET | Freq: Four times a day (QID) | ORAL | Status: DC | PRN
Start: 2014-07-23 — End: 2014-07-29

## 2014-07-23 NOTE — Progress Notes (Signed)
Has the patient sought any care outside of the [facility]? YES    Chiropractor

## 2014-07-23 NOTE — Progress Notes (Signed)
Subjective:       Patient ID: Kellie Fuentes is a 58 y.o. female.    HPI    The patient awakened 2 weeks ago with significant neck pain and paresthesias of both arms.  She saw her chiropractor, but her symptoms continue unabated.  She wakes up with numbness in both arms and pain in the right hand.  She is having significant neck pain.  She has had to use narcotics to try to sleep.  There is no antecedent injury or trauma.  She has no previous documented history of neurapraxia.  She has no known neck issues.    She is otherwise at baseline.    Review of Systems   All other systems reviewed and are negative.          Objective:    Physical Exam   Constitutional: She appears well-developed and well-nourished. No distress.   HENT:   Head: Normocephalic and atraumatic.   Mouth/Throat: Oropharynx is clear and moist.   Eyes: Conjunctivae are normal.   Neck: Normal range of motion. Neck supple.   Cardiovascular: Normal rate and regular rhythm.    Pulmonary/Chest: Effort normal and breath sounds normal.   Musculoskeletal:   The patient has no obvious atrophy or fasciculation.  Range of motion of the neck is acceptable.  She has weakness with right hand grip and a positive Phalen's maneuver.  Reflexes are grossly intact.   Vitals reviewed.          Assessment:       Paresthesias and neck pain.  The patient must be assumed to have a cervical radiculopathy until the contrary is proved.      Plan:      Procedures    I have arranged for an EMG today.  The patient will have an MRI of her cervical spine promptly.  I provided some Percocet as a temporary pain measure.  I also refilled her Xanax which she has had to use more frequently at night.  I will follow her up promptly and pursue this matter as appropriate

## 2014-07-23 NOTE — Telephone Encounter (Signed)
Patient said that when making an appt for the MRI, the scheduler said how scary it was etc..Marland KitchenShe want to know if open MRI is just as good as the regular one? Patient # is [phone]

## 2014-07-24 NOTE — Telephone Encounter (Signed)
Left message for patient that Dr. Einar Crow said that if patient wanted the open MRI he could provide an order for Cervical MRI wo contrast.

## 2014-07-25 ENCOUNTER — Telehealth: Payer: Self-pay | Admitting: Internal Medicine

## 2014-07-25 NOTE — Telephone Encounter (Signed)
fyi patient is getting MRI done tomorrow

## 2014-07-28 ENCOUNTER — Other Ambulatory Visit: Payer: Self-pay | Admitting: Internal Medicine

## 2014-07-29 ENCOUNTER — Encounter: Payer: Self-pay | Admitting: Internal Medicine

## 2014-07-29 ENCOUNTER — Ambulatory Visit: Payer: Commercial Managed Care - POS | Admitting: Internal Medicine

## 2014-07-29 VITALS — BP 156/64 | HR 75 | Temp 97.8°F | Resp 17 | Ht 63.0 in | Wt 174.0 lb

## 2014-07-29 DIAGNOSIS — R52 Pain, unspecified: Secondary | ICD-10-CM

## 2014-07-29 MED ORDER — OXYCODONE-ACETAMINOPHEN 5-325 MG PO TABS
1.0000 | ORAL_TABLET | Freq: Four times a day (QID) | ORAL | Status: DC | PRN
Start: 2014-07-29 — End: 2014-08-12

## 2014-07-29 NOTE — Progress Notes (Signed)
Subjective:       Patient ID: Kellie Fuentes is a 58 y.o. female.    HPI    The patient presents for follow-up.  The EMG showed a modest right carpal tunnel which is inconsistent with the distribution of her discomfort.  The MRI of the cervical spine showed nothing of concern related to her peripheral symptoms.  The patient has significant pain in an ulnar distribution, most prominent in the 3rd, 4th and 5th fingers.  She seems to have some related discomfort, although less so, in the right axilla.  On further interview, the patient does describe some occasional bluish color of her right hand.  The pain is severe and requires narcotics.  She is not sleeping because of the pain.  She presents to discuss the next step.    Review of Systems   All other systems reviewed and are negative.          Objective:    Physical Exam   Constitutional: She appears well-developed and well-nourished. No distress.   HENT:   Head: Normocephalic and atraumatic.   Mouth/Throat: Oropharynx is clear and moist.   Eyes: Conjunctivae are normal.   Neck: Normal range of motion. Neck supple.   Cardiovascular: Normal rate and regular rhythm.    Pulmonary/Chest: Effort normal and breath sounds normal.   Musculoskeletal:   The neuromuscular examination continues to show pain in an ulnar distribution.  The 4th and 5th fingers will lock up when the pain is severe.  On palpation of the right axilla, she has some discomfort as well.  I do not palpate a discrete lesion in that location.   Vitals reviewed.          Assessment:       Ongoing neuropathic pain.  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  The etiology is not clear.  She does have a history of lupus, but it has been minimal.      Plan:      Procedures    I will again discuss this with specialists, but my inclination would be to get a CAT scan of the chest to rule out a right apical lung lesion or an axillary lesion.  I will  continue to pursue evaluation.  Percocet was again provided pending further evaluation and a firm diagnosis.

## 2014-07-30 ENCOUNTER — Telehealth: Payer: Self-pay | Admitting: Internal Medicine

## 2014-07-30 NOTE — Telephone Encounter (Signed)
Patient said if there is a number were where she can call you? Ok to leave message for # and time.

## 2014-07-31 ENCOUNTER — Other Ambulatory Visit: Payer: Self-pay | Admitting: Internal Medicine

## 2014-07-31 DIAGNOSIS — R52 Pain, unspecified: Secondary | ICD-10-CM

## 2014-07-31 MED ORDER — GABAPENTIN 100 MG PO CAPS
100.0000 mg | ORAL_CAPSULE | Freq: Three times a day (TID) | ORAL | Status: DC
Start: 2014-07-31 — End: 2014-08-16

## 2014-08-10 ENCOUNTER — Encounter: Payer: Self-pay | Admitting: Internal Medicine

## 2014-08-12 ENCOUNTER — Other Ambulatory Visit: Payer: Self-pay

## 2014-08-12 ENCOUNTER — Telehealth: Payer: Self-pay

## 2014-08-12 DIAGNOSIS — G4720 Circadian rhythm sleep disorder, unspecified type: Secondary | ICD-10-CM

## 2014-08-12 DIAGNOSIS — R52 Pain, unspecified: Secondary | ICD-10-CM

## 2014-08-12 MED ORDER — OXYCODONE-ACETAMINOPHEN 5-325 MG PO TABS
1.0000 | ORAL_TABLET | Freq: Four times a day (QID) | ORAL | Status: DC | PRN
Start: 2014-08-12 — End: 2015-02-13

## 2014-08-12 MED ORDER — ALPRAZOLAM 0.5 MG PO TABS
0.5000 mg | ORAL_TABLET | Freq: Two times a day (BID) | ORAL | Status: DC | PRN
Start: 2014-08-12 — End: 2014-10-21

## 2014-08-12 NOTE — Telephone Encounter (Signed)
Spoke to patient in regard to unread MyChart message sent by Dr. Einar Crow on 08/10/2014. Patient stated that she has seen the rheumatologist and that Dr. Cyndie Chime finished his eval over one week ago. Patient wants you to contact Dr. Cyndie Chime to get the report.     [phone]

## 2014-08-13 ENCOUNTER — Telehealth: Payer: Self-pay

## 2014-08-13 NOTE — Telephone Encounter (Signed)
Left message for patient that Alprazolam was phoned in to the pharmacy and Percocet is ready for pickup at the front desk.

## 2014-08-13 NOTE — Telephone Encounter (Signed)
Patient wanted [place] to contact Dr. Weyman Rodney office to get a copy of last office notes. Attempted to call office at [phone] - but it was after hours. Will call again tomorrow.

## 2014-08-14 NOTE — Telephone Encounter (Signed)
Spoke to Dr. Weyman Rodney office. They are faxing last office note.

## 2014-08-16 ENCOUNTER — Other Ambulatory Visit: Payer: Self-pay | Admitting: Internal Medicine

## 2014-08-19 ENCOUNTER — Encounter: Payer: Self-pay | Admitting: Internal Medicine

## 2014-09-02 ENCOUNTER — Other Ambulatory Visit: Payer: Self-pay | Admitting: Internal Medicine

## 2014-09-16 ENCOUNTER — Other Ambulatory Visit: Payer: Self-pay

## 2014-09-16 MED ORDER — LOVASTATIN 20 MG PO TABS
20.0000 mg | ORAL_TABLET | Freq: Every evening | ORAL | Status: DC
Start: 2014-09-16 — End: 2015-02-13

## 2014-09-29 ENCOUNTER — Other Ambulatory Visit: Payer: Self-pay | Admitting: Internal Medicine

## 2014-10-09 ENCOUNTER — Telehealth (INDEPENDENT_AMBULATORY_CARE_PROVIDER_SITE_OTHER): Payer: Self-pay

## 2014-10-09 NOTE — Telephone Encounter (Signed)
Left message for patient that her insurance no longer covers Freestyle test strips and they want her to switch to One Touch. Assured patient that we could provide new rx, but need to know how patient would like to proceed.

## 2014-10-21 ENCOUNTER — Other Ambulatory Visit (INDEPENDENT_AMBULATORY_CARE_PROVIDER_SITE_OTHER): Payer: Self-pay

## 2014-10-21 ENCOUNTER — Other Ambulatory Visit: Payer: Self-pay | Admitting: Internal Medicine

## 2014-10-21 MED ORDER — ALPRAZOLAM 0.5 MG PO TABS
0.50 mg | ORAL_TABLET | Freq: Two times a day (BID) | ORAL | Status: DC | PRN
Start: 2014-10-21 — End: 2014-11-19

## 2014-10-21 NOTE — Telephone Encounter (Signed)
Rx faxed

## 2014-10-24 ENCOUNTER — Other Ambulatory Visit: Payer: Self-pay | Admitting: Internal Medicine

## 2014-11-19 ENCOUNTER — Other Ambulatory Visit (INDEPENDENT_AMBULATORY_CARE_PROVIDER_SITE_OTHER): Payer: Self-pay | Admitting: Internal Medicine

## 2014-11-20 MED ORDER — ALPRAZOLAM 0.5 MG PO TABS
0.5000 mg | ORAL_TABLET | Freq: Two times a day (BID) | ORAL | Status: DC | PRN
Start: 2014-11-20 — End: 2015-03-18

## 2014-12-04 ENCOUNTER — Other Ambulatory Visit: Payer: Self-pay | Admitting: Internal Medicine

## 2014-12-31 ENCOUNTER — Encounter (INDEPENDENT_AMBULATORY_CARE_PROVIDER_SITE_OTHER): Payer: Self-pay | Admitting: Internal Medicine

## 2015-01-18 ENCOUNTER — Other Ambulatory Visit: Payer: Self-pay | Admitting: Internal Medicine

## 2015-02-13 ENCOUNTER — Emergency Department: Payer: Commercial Managed Care - POS

## 2015-02-13 ENCOUNTER — Emergency Department
Admission: EM | Admit: 2015-02-13 | Discharge: 2015-02-13 | Disposition: A | Discharge status: Home / Self Care | Payer: Commercial Managed Care - POS | Attending: Emergency Medicine | Admitting: Emergency Medicine

## 2015-02-13 ENCOUNTER — Ambulatory Visit (INDEPENDENT_AMBULATORY_CARE_PROVIDER_SITE_OTHER): Payer: Commercial Managed Care - POS | Admitting: Internal Medicine

## 2015-02-13 ENCOUNTER — Encounter (INDEPENDENT_AMBULATORY_CARE_PROVIDER_SITE_OTHER): Payer: Self-pay | Admitting: Internal Medicine

## 2015-02-13 ENCOUNTER — Other Ambulatory Visit: Payer: Self-pay

## 2015-02-13 VITALS — BP 151/73 | HR 76 | Temp 98.4°F | Ht 63.0 in | Wt 169.4 lb

## 2015-02-13 DIAGNOSIS — M329 Systemic lupus erythematosus, unspecified: Secondary | ICD-10-CM | POA: Insufficient documentation

## 2015-02-13 DIAGNOSIS — E079 Disorder of thyroid, unspecified: Secondary | ICD-10-CM | POA: Insufficient documentation

## 2015-02-13 DIAGNOSIS — E785 Hyperlipidemia, unspecified: Secondary | ICD-10-CM | POA: Insufficient documentation

## 2015-02-13 DIAGNOSIS — E039 Hypothyroidism, unspecified: Secondary | ICD-10-CM

## 2015-02-13 DIAGNOSIS — R109 Unspecified abdominal pain: Secondary | ICD-10-CM

## 2015-02-13 DIAGNOSIS — E119 Type 2 diabetes mellitus without complications: Secondary | ICD-10-CM

## 2015-02-13 DIAGNOSIS — I10 Essential (primary) hypertension: Secondary | ICD-10-CM | POA: Insufficient documentation

## 2015-02-13 DIAGNOSIS — Z87891 Personal history of nicotine dependence: Secondary | ICD-10-CM | POA: Insufficient documentation

## 2015-02-13 DIAGNOSIS — R0781 Pleurodynia: Secondary | ICD-10-CM

## 2015-02-13 LAB — CBC AND DIFFERENTIAL
Basophils Absolute Automated: 0.02 10*3/uL (ref 0.00–0.20)
Basophils Automated: 0 %
Eosinophils Absolute Automated: 0.07 10*3/uL (ref 0.00–0.70)
Eosinophils Automated: 2 %
Hematocrit: 33.3 % — ABNORMAL LOW (ref 37.0–47.0)
Hgb: 11.5 g/dL — ABNORMAL LOW (ref 12.0–16.0)
Immature Granulocytes Absolute: 0.02 10*3/uL
Immature Granulocytes: 0 %
Lymphocytes Absolute Automated: 0.76 10*3/uL (ref 0.50–4.40)
Lymphocytes Automated: 16 %
MCH: 30.5 pg (ref 28.0–32.0)
MCHC: 34.5 g/dL (ref 32.0–36.0)
MCV: 88.3 fL (ref 80.0–100.0)
MPV: 9.3 fL — ABNORMAL LOW (ref 9.4–12.3)
Monocytes Absolute Automated: 0.35 10*3/uL (ref 0.00–1.20)
Monocytes: 7 %
Neutrophils Absolute: 3.6 10*3/uL (ref 1.80–8.10)
Neutrophils: 75 %
Nucleated RBC: 0 /100 WBC (ref 0–1)
Platelets: 364 10*3/uL (ref 140–400)
RBC: 3.77 10*6/uL — ABNORMAL LOW (ref 4.20–5.40)
RDW: 14 % (ref 12–15)
WBC: 4.82 10*3/uL (ref 3.50–10.80)

## 2015-02-13 LAB — ECG 12-LEAD
Atrial Rate: 80 {beats}/min
P Axis: 61 degrees
P-R Interval: 150 ms
Q-T Interval: 372 ms
QRS Duration: 78 ms
QTC Calculation (Bezet): 429 ms
R Axis: 4 degrees
T Axis: 30 degrees
Ventricular Rate: 80 {beats}/min

## 2015-02-13 LAB — COMPREHENSIVE METABOLIC PANEL
ALT: 23 U/L (ref 0–55)
AST (SGOT): 17 U/L (ref 5–34)
Albumin/Globulin Ratio: 1.5 (ref 0.9–2.2)
Albumin: 4.1 g/dL (ref 3.5–5.0)
Alkaline Phosphatase: 98 U/L (ref 37–106)
BUN: 11 mg/dL (ref 7.0–19.0)
Bilirubin, Total: 0.7 mg/dL (ref 0.2–1.2)
CO2: 26 mEq/L (ref 22–29)
Calcium: 9.2 mg/dL (ref 8.5–10.5)
Chloride: 106 mEq/L (ref 100–111)
Creatinine: 0.6 mg/dL (ref 0.6–1.0)
Globulin: 2.7 g/dL (ref 2.0–3.6)
Glucose: 115 mg/dL — ABNORMAL HIGH (ref 70–100)
Potassium: 3.9 mEq/L (ref 3.5–5.1)
Protein, Total: 6.8 g/dL (ref 6.0–8.3)
Sodium: 139 mEq/L (ref 136–145)

## 2015-02-13 LAB — LIPASE: Lipase: 5 U/L — ABNORMAL LOW (ref 8–78)

## 2015-02-13 LAB — PT AND APTT
PT INR: 1.1 (ref 0.9–1.1)
PT: 14 s (ref 12.6–15.0)
PTT: 51 s — ABNORMAL HIGH (ref 23–37)

## 2015-02-13 LAB — GFR: EGFR: >60

## 2015-02-13 LAB — IHS D-DIMER: D-Dimer: 1.73 ug/mL FEU — ABNORMAL HIGH (ref 0.00–0.49)

## 2015-02-13 MED ORDER — ACETAMINOPHEN 500 MG PO TABS
1000.0000 mg | ORAL_TABLET | Freq: Once | ORAL | Status: AC
Start: 2015-02-13 — End: 2015-02-13
  Administered 2015-02-13: 1000 mg via ORAL
  Filled 2015-02-13: qty 2

## 2015-02-13 MED ORDER — LEVOFLOXACIN 500 MG PO TABS
500.0000 mg | ORAL_TABLET | Freq: Every day | ORAL | 0 refills | Status: AC
Start: 2015-02-13 — End: 2015-02-23
  Filled 2015-02-13: qty 7, 7d supply, fill #0

## 2015-02-13 MED ORDER — IOHEXOL 350 MG/ML IV SOLN
60.0000 mL | Freq: Once | INTRAVENOUS | Status: AC | PRN
Start: 2015-02-13 — End: 2015-02-13
  Administered 2015-02-13: 60 mL via INTRAVENOUS

## 2015-02-13 MED ORDER — SODIUM CHLORIDE 0.9 % IV BOLUS
1000.0000 mL | Freq: Once | INTRAVENOUS | Status: AC
Start: 2015-02-13 — End: 2015-02-13
  Administered 2015-02-13: 1000 mL via INTRAVENOUS

## 2015-02-13 NOTE — ED Provider Notes (Signed)
[facility] EMERGENCY DEPARTMENT H&P         CLINICAL SUMMARY          Diagnosis:    .     Final diagnoses:   Pleuritic chest pain   SLE exacerbation         MDM Notes:      DDx: PE vs pneumothroax vs herpes zoster vs non-cardiac CP vs pancreatitis vs ulcer      Disposition:          ED Disposition     Discharge Kellie Fuentes discharge to home/self care.    Condition at disposition: Stable                      CLINICAL INFORMATION        HPI:      Chief Complaint: Back Pain  .    Kellie Fuentes is a 59 y.o. female with h/o lupus, DM, and HTN who presents w/ mid-back pain beginning 1 week ago, becoming severe yesterday. Pain is worse with deep breaths. Pain is not positional. She also endorses associated left inner thigh/groin pain with some radiation down her LLE. She was seen by her PCP, Dr. Einar Crow today and was referred to the ER for r/o PE.   Moderate severity.   Non-smoker.  No recent long car rides or immobilizations.   No recent illness. No cough. No rash. No N/V/D. No fever.       History obtained from: Patient          ROS:      Positive and negative ROS elements as per HPI.  All other systems reviewed and negative.      Physical Exam:      Pulse 81  BP 156/62 mmHg  Resp 14  SpO2 96 %  Temp 98.7 F (37.1 C)    Pulse Oximetry Analysis - Normal  CONSTITUTIONAL: NAD   HEENT: PERLA. EOMI   CVS:  Pulse 81    CHEST: clear bilaterally   ABDO: soft, nd, nt  EXT: moves all extremities. No calf or hip TTP  NEURO: no focal deficits noted   SKIN: no rash  PSYCH:  Normal affect and appearance  LYMPH:  No significant lymphadenopathy              PAST HISTORY        Primary Care Provider: Lenord Fellers, MD        PMH/PSH:    .     Past Medical History   Diagnosis Date   . Lupus    . Diabetes mellitus    . Hyperlipidemia    . Hypertension    . Seasonal allergies    . Thyroid disease        She has past surgical history that includes Breast surgery; Knee surgery; and LAPAROSCOPY, DIAGNOSTIC.       Social/Family History:      She reports that she has quit smoking. She has never used smokeless tobacco. She reports that she drinks about 0.6 oz of alcohol per week. She reports that she does not use illicit drugs.    Family History   Problem Relation Age of Onset   . Cancer Mother    . Arthritis Mother    . Thyroid disease Mother    . Cancer Father    . Heart disease Father          Listed Medications on Arrival:    .  Discharge Medication List as of 02/13/2015  2:26 PM      CONTINUE these medications which have NOT CHANGED    Details   ALPRAZolam (XANAX) 0.5 MG tablet Take 1 tablet (0.5 mg total) by mouth 2 (two) times daily as needed., Starting 11/20/2014, Until Discontinued, Print      B-D ULTRAFINE III SHORT PEN 31G X 8 MM Misc Historical Med      fluticasone (CUTIVATE) 0.05 % cream Apply topically daily.   , Starting 11/15/2013, Until Discontinued, Historical Med      !! FREESTYLE LITE test strip TEST 3 TIMES DAILY AS NEEDED, Normal      !! FREESTYLE LITE test strip TEST 3 TIMES DAILY AS NEEDED, Normal      glimepiride (AMARYL) 4 MG tablet TAKE 1 TABLET (4 MG TOTAL) BY MOUTH 2 (TWO) TIMES DAILY., Normal      !! glucose blood (FREESTYLE LITE) test strip 1 each by Other route 3 (three) times daily as needed for Other. Use, Starting 08/23/2013, Until Discontinued, Normal      hydroxychloroquine (PLAQUENIL) 200 MG tablet Take 200 mg by mouth daily. , Starting 08/23/2013, Until Discontinued, Historical Med      ibuprofen (ADVIL,MOTRIN) 800 MG tablet TAKE 1 TABLET (800 MG TOTAL) BY MOUTH EVERY 6 (SIX) HOURS AS NEEDED., Normal      irbesartan (AVAPRO) 75 MG tablet TAKE 1 TABLET (75 MG TOTAL) ONCE A DAY AT NIGHT, Normal      LANTUS SOLOSTAR 100 UNIT/ML injection pen INJECT 60 UNITS INTO THE SKIN DAILY., Normal      levothyroxine (SYNTHROID, LEVOTHROID) 75 MCG tablet TAKE 1 TABLET BY MOUTH DAILY, Normal      montelukast (SINGULAIR) 10 MG tablet Take 10 mg by mouth 2 (two) times daily. , Starting 04/14/2013, Until  Discontinued, Historical Med      Omeprazole (PRILOSEC PO) Take by mouth., Until Discontinued, Historical Med      valACYclovir HCL (VALTREX) 500 MG tablet Take 500 mg by mouth daily as needed., Starting 05/18/2013, Until Discontinued, Historical Med       !! - Potential duplicate medications found. Please discuss with provider.         Allergies: She is allergic to penicillins and ampicillin.            VISIT INFORMATION        Clinical Course in the ED:      2:20 PM  Had an extensive conversation about the results with patient. Feels comfortable going home with abx. Will refer her to pulmonology.  Case d/w Dr. Lilian Kapur - covering for Dr. Einar Crow.  They will follow.  Pt feels more comfortable on ABx and will start on Levaquin. She will follow up with Pulm and GI.      Medications Given in the ED:    .     ED Medication Orders     Start Ordered     Status Ordering Provider    02/13/15 1216 02/13/15 1215  sodium chloride 0.9 % bolus 1,000 mL   Once     Route: Intravenous  Ordered Dose: 1,000 mL     Last MAR action:  Stopped Kellie Fuentes    02/13/15 1004 02/13/15 1003  acetaminophen (TYLENOL) tablet 1,000 mg   Once     Route: Oral  Ordered Dose: 1,000 mg     Last MAR action:  Given Kellie Fuentes    02/13/15 0934 02/13/15 0933  sodium chloride 0.9 % bolus 1,000 mL  Once     Route: Intravenous  Ordered Dose: 1,000 mL     Last MAR action:  Stopped Kellie Fuentes            Procedures:      Procedures      Interpretations:      EKG -             interpreted by me: NSR rate of 80. No acute ST elevations or depressions. ST flattening in III.                RESULTS        Lab Results:      Results     Procedure Component Value Units Date/Time    D-Dimer [[id number]]  (Abnormal) Collected:  02/13/15 1007     D-Dimer 1.73 (H) ug/mL FEU Updated:  02/13/15 1048    PT/APTT [[id number]]  (Abnormal) Collected:  02/13/15 1007     PT 14.0 sec Updated:  02/13/15 1048     PT INR 1.1      PT Anticoag. Given Within 48 hrs. None      PTT 51 (H)  sec     Lipase [[id number]]  (Abnormal) Collected:  02/13/15 1007    Specimen Information:  Blood Updated:  02/13/15 1036     Lipase 5 (L) U/L     Comprehensive metabolic panel [[id number]]  (Abnormal) Collected:  02/13/15 1007    Specimen Information:  Blood Updated:  02/13/15 1036     Glucose 115 (H) mg/dL      BUN 62.9 mg/dL      Creatinine 0.6 mg/dL      Sodium 528 mEq/L      Potassium 3.9 mEq/L      Chloride 106 mEq/L      CO2 26 mEq/L      CALCIUM 9.2 mg/dL      Protein, Total 6.8 g/dL      Albumin 4.1 g/dL      AST (SGOT) 17 U/L      ALT 23 U/L      Alkaline Phosphatase 98 U/L      Bilirubin, Total 0.7 mg/dL      Globulin 2.7 g/dL      Albumin/Globulin Ratio 1.5     GFR [[id number]] Collected:  02/13/15 1007     EGFR >60.0 Updated:  02/13/15 1036    CBC with differential [[id number]]  (Abnormal) Collected:  02/13/15 1007    Specimen Information:  Blood / Blood Updated:  02/13/15 1022     WBC 4.82 x10 3/uL      Hgb 11.5 (L) g/dL      Hematocrit 03.4 (L) %      Platelets 364 x10 3/uL      RBC 3.77 (L) x10 6/uL      MCV 88.3 fL      MCH 30.5 pg      MCHC 34.5 g/dL      RDW 14 %      MPV 9.3 (L) fL      Neutrophils 75 %      Lymphocytes Automated 16 %      Monocytes 7 %      Eosinophils Automated 2 %      Basophils Automated 0 %      Immature Granulocyte 0 %      Nucleated RBC 0 /100 WBC      Neutrophils Absolute 3.60 x10 3/uL      Abs Lymph Automated 0.76 x10  3/uL      Abs Mono Automated 0.35 x10 3/uL      Abs Eos Automated 0.07 x10 3/uL      Absolute Baso Automated 0.02 x10 3/uL      Absolute Immature Granulocyte 0.02 x10 3/uL               Radiology Results:      CT Angio Chest (PE or trauma protocol)   Final Result    No evidence of pulmonary embolus.. Minimal atelectasis.      Rocky Crafts, MD    02/13/2015 1:24 PM         Chest 2 Views   Final Result    Left lower lobe linear atelectasis.      Annamary Carolin, MD    02/13/2015 10:09 AM                     Scribe Attestation:      Attestations:  I was acting as  a Neurosurgeon for Mickie Hillier, MD on Benita Stabile  Scribe: Nancy Marus   I am the first provider for this patient and I personally performed the services documented. Manal Burna Sis is scribing for me on Gordin,Danie A. This note accurately reflects work and decisions made by me.  Mickie Hillier, MD             Mickie Hillier, MD  02/13/15 [phone]

## 2015-02-13 NOTE — ED Notes (Signed)
Pt report coming to ED r/t low left-sided back pain that started approx one week with pain increasing everyday with Primary MD instructing pt to come to ED for further eval. Pt denies difficulty with ambulation or with urination. Pt A&O x4 with no distress noted

## 2015-02-13 NOTE — Discharge Instructions (Signed)
Pleurisy  Take levaquin once a day for 7 days.  Take tylenol 2 tablets every 8 hours as needed for pain.  Follow up with DR. Lamberti of pulmonology and Dr. Marton Redwood of GI once you call and make an appointment.   Drink plenty of fluids.    You have been diagnosed with pleurisy.    Pleurisy is a swelling or irritation. It affects the chest cavity lining and lungs and causes sharp chest pain. The pain is worse when breathing in (inhaling).    There are other more serious problems that can cause pain with breathing, however. The doctor who saw you today believes your problem is not serious.    You had a blood test called a D-Dimer that tests for blood clots. The test results show it is unlikely that a blood clot in the lung is causing your pain.    Treatment may include an anti-inflammatory medicine. If you were given a prescription for an anti-inflammatory medicine, do not take other over-the-counter (without prescription) anti-inflammatories like ibuprofen (Advil or Motrin) and naproxen (Naprosyn or Aleve).    Take deep breaths. This will keep your lungs expanded and help prevent partial lung collapse (atelectasis) from developing. It will also prevent pneumonia.    YOU SHOULD SEEK MEDICAL ATTENTION IMMEDIATELY, EITHER HERE OR AT THE NEAREST EMERGENCY DEPARTMENT, IF ANY OF THE FOLLOWING OCCURS:   Trouble breathing or wheezing.   Coughing up blood.   Fever (temperature higher than 100.57F / 38C) or any fever that doesn't go away.   Chest pain.   No improvement in 48 to 72 hours or symptoms get worse.

## 2015-02-13 NOTE — Progress Notes (Signed)
Subjective:       Patient ID: Kellie Fuentes is a 59 y.o. female.    HPI    The patient presents with a seven-day history of progressive left chest and upper back pain. It is especially painful with inspiration. She denies other associated breathing symptoms. She does describe some nonspecific abdominal bloating and dyspepsia. She is otherwise at baseline. She is due for followup lab work with regard to her chronic medical problems.    Review of Systems   All other systems reviewed and are negative.          Objective:    Physical Exam   Constitutional: She appears well-developed and well-nourished. No distress.   HENT:   Head: Normocephalic and atraumatic.   Mouth/Throat: Oropharynx is clear and moist.   Eyes: Conjunctivae are normal.   Neck: Normal range of motion. Neck supple.   Cardiovascular: Normal rate and regular rhythm.    Pulmonary/Chest: Effort normal and breath sounds normal. She has no wheezes. She has no rales.   I did not appreciate a rub on examination   Abdominal: Soft. Bowel sounds are normal. She exhibits no mass. There is no tenderness.   Vitals reviewed.          Assessment:       Pleuritic left flank and back pain.       Plan:      Procedures    Although this could be a flare of lupus, the presentation is not typical for pulmonary emboli. She is therefore sent to the ER for a CTA to exclude this possibility. We will follow appropriately. Patient will return for followup medical evaluation after the acute issues have been sorted out

## 2015-02-14 NOTE — ED Notes (Signed)
ER EKG AUDIT DONE: PT ID STICKER AFFIXED TO PRINTED COPY;  ORDER(S) CONFIRMED, COPY IN MUSE CONFIRMED ; Md SIGNED COPY SCANNED TO EPIC ACCOUNT

## 2015-03-07 ENCOUNTER — Ambulatory Visit (INDEPENDENT_AMBULATORY_CARE_PROVIDER_SITE_OTHER): Payer: Commercial Managed Care - POS | Admitting: Internal Medicine

## 2015-03-07 ENCOUNTER — Encounter (INDEPENDENT_AMBULATORY_CARE_PROVIDER_SITE_OTHER): Payer: Self-pay | Admitting: Internal Medicine

## 2015-03-07 VITALS — BP 141/50 | HR 77 | Temp 98.5°F | Ht 63.0 in

## 2015-03-07 DIAGNOSIS — R109 Unspecified abdominal pain: Secondary | ICD-10-CM

## 2015-03-07 NOTE — Progress Notes (Signed)
Has the patient sought any care outside of the [facility]? YES    Orthopaedist - Right Foot Fx - patient on an alternating regimen of Vicodin 5-300 and Motrin 800 mg

## 2015-03-07 NOTE — Progress Notes (Signed)
Subjective:       Patient ID: Kellie Fuentes is a 59 y.o. female.    HPI    The patient was seen April 21 with left-sided chest and abdominal discomfort.  She was sent to the ER to exclude pulmonary embolism.  She had a CTA which showed nothing of great concern.  She presents for follow-up.  Because the pain persists.  It is located in the left lateral lower quadrant of the abdomen in the left low back, primarily now.  It is minimally pruritic.  It is worse when the patient is constipated and better when she has evacuated her bowel.  She has had the use laxatives to get this accomplished.  She is taking narcotics which were provided after she fractured her foot.  Subsequent to this.  She presents to pursue further evaluation.  She says her last colonoscopy was 6 years ago and no worrisome pathology was found.    Review of Systems   All other systems reviewed and are negative.          Objective:    Physical Exam   Constitutional: She appears well-developed and well-nourished. No distress.   HENT:   Head: Normocephalic and atraumatic.   Mouth/Throat: Oropharynx is clear and moist.   Eyes: Conjunctivae are normal.   Neck: Normal range of motion. Neck supple.   Cardiovascular: Normal rate and regular rhythm.    Pulmonary/Chest: Effort normal and breath sounds normal.   Abdominal:   The patient has pain to palpation of the left mid and lower quadrants with no discrete mass identified.   Vitals reviewed.          Assessment:       Persistent pain is described.      Plan:      Procedures    We will obtain a CT of the abdomen and pelvis with contrast as soon as feasible.  She is scheduled to see a gastrointestinal doctor in 10 days.  I will call her as soon as the CT is available.

## 2015-03-08 ENCOUNTER — Telehealth (INDEPENDENT_AMBULATORY_CARE_PROVIDER_SITE_OTHER): Payer: Self-pay | Admitting: Internal Medicine

## 2015-03-08 NOTE — Telephone Encounter (Signed)
I spoke w pt who said she was trying to get her abd and pelvis ct w po and ivc done but the radiology dept wld not do it because there was no dob on the script.  I called her radiology dept and gave them the pt's dob and they said she cld get her ct done.  I called the pt back and let her know. Pt understood and agreed.

## 2015-03-18 ENCOUNTER — Other Ambulatory Visit (INDEPENDENT_AMBULATORY_CARE_PROVIDER_SITE_OTHER): Payer: Self-pay

## 2015-03-18 MED ORDER — ALPRAZOLAM 0.5 MG PO TABS
0.5000 mg | ORAL_TABLET | Freq: Two times a day (BID) | ORAL | Status: DC | PRN
Start: 2015-03-18 — End: 2015-04-15

## 2015-03-18 NOTE — Telephone Encounter (Signed)
Rx faxed

## 2015-03-26 ENCOUNTER — Telehealth (INDEPENDENT_AMBULATORY_CARE_PROVIDER_SITE_OTHER): Payer: Self-pay | Admitting: Internal Medicine

## 2015-03-26 NOTE — Telephone Encounter (Signed)
Patient called that Dr.Ranard does not have anything till Sept. Need another recommendation for GI [phone]

## 2015-03-27 ENCOUNTER — Encounter (INDEPENDENT_AMBULATORY_CARE_PROVIDER_SITE_OTHER): Payer: Self-pay | Admitting: Internal Medicine

## 2015-03-31 ENCOUNTER — Telehealth (INDEPENDENT_AMBULATORY_CARE_PROVIDER_SITE_OTHER): Payer: Self-pay

## 2015-03-31 NOTE — Telephone Encounter (Signed)
Left message for patient in regard to unread MyChart message sent by Dr. Einar Crow on 03/27/2015. Instructed patient to call back with questions/concerns.

## 2015-04-15 ENCOUNTER — Other Ambulatory Visit (INDEPENDENT_AMBULATORY_CARE_PROVIDER_SITE_OTHER): Payer: Self-pay

## 2015-04-15 MED ORDER — ALPRAZOLAM 0.5 MG PO TABS
0.5000 mg | ORAL_TABLET | Freq: Two times a day (BID) | ORAL | Status: DC | PRN
Start: 2015-04-15 — End: 2015-08-04

## 2015-04-15 NOTE — Telephone Encounter (Signed)
Rx faxed

## 2015-06-13 ENCOUNTER — Other Ambulatory Visit: Payer: Self-pay | Admitting: Internal Medicine

## 2015-07-17 ENCOUNTER — Other Ambulatory Visit (INDEPENDENT_AMBULATORY_CARE_PROVIDER_SITE_OTHER): Payer: Self-pay

## 2015-07-17 MED ORDER — LEVOTHYROXINE SODIUM 75 MCG PO TABS
75.0000 ug | ORAL_TABLET | Freq: Every day | ORAL | Status: DC
Start: 2015-07-17 — End: 2015-10-11

## 2015-07-17 MED ORDER — GLIMEPIRIDE 4 MG PO TABS
4.0000 mg | ORAL_TABLET | Freq: Two times a day (BID) | ORAL | Status: DC
Start: 2015-07-17 — End: 2015-10-11

## 2015-08-04 ENCOUNTER — Other Ambulatory Visit (INDEPENDENT_AMBULATORY_CARE_PROVIDER_SITE_OTHER): Payer: Self-pay

## 2015-08-05 MED ORDER — ALPRAZOLAM 0.5 MG PO TABS
0.5000 mg | ORAL_TABLET | Freq: Two times a day (BID) | ORAL | Status: DC | PRN
Start: 2015-08-05 — End: 2015-10-10

## 2015-08-05 NOTE — Telephone Encounter (Signed)
Rx faxed

## 2015-08-11 ENCOUNTER — Other Ambulatory Visit (INDEPENDENT_AMBULATORY_CARE_PROVIDER_SITE_OTHER): Payer: Self-pay

## 2015-08-11 MED ORDER — INSULIN GLARGINE 100 UNIT/ML SC SOPN
60.0000 [IU] | PEN_INJECTOR | Freq: Every day | SUBCUTANEOUS | Status: DC
Start: 2015-08-11 — End: 2015-10-17

## 2015-09-01 ENCOUNTER — Encounter (INDEPENDENT_AMBULATORY_CARE_PROVIDER_SITE_OTHER): Payer: Self-pay

## 2015-09-06 ENCOUNTER — Other Ambulatory Visit: Payer: Self-pay | Admitting: Internal Medicine

## 2015-09-22 ENCOUNTER — Other Ambulatory Visit: Payer: Self-pay | Admitting: Internal Medicine

## 2015-10-03 ENCOUNTER — Encounter (INDEPENDENT_AMBULATORY_CARE_PROVIDER_SITE_OTHER): Payer: Self-pay

## 2015-10-06 ENCOUNTER — Telehealth (INDEPENDENT_AMBULATORY_CARE_PROVIDER_SITE_OTHER): Payer: Self-pay

## 2015-10-07 ENCOUNTER — Encounter (INDEPENDENT_AMBULATORY_CARE_PROVIDER_SITE_OTHER): Payer: Self-pay

## 2015-10-07 ENCOUNTER — Ambulatory Visit (INDEPENDENT_AMBULATORY_CARE_PROVIDER_SITE_OTHER): Payer: Commercial Managed Care - POS | Admitting: Internal Medicine

## 2015-10-07 ENCOUNTER — Encounter (INDEPENDENT_AMBULATORY_CARE_PROVIDER_SITE_OTHER): Payer: Self-pay | Admitting: Internal Medicine

## 2015-10-07 VITALS — BP 126/74 | HR 75 | Temp 98.4°F | Ht 63.0 in | Wt 172.4 lb

## 2015-10-07 DIAGNOSIS — E138 Other specified diabetes mellitus with unspecified complications: Secondary | ICD-10-CM

## 2015-10-07 DIAGNOSIS — Z139 Encounter for screening, unspecified: Secondary | ICD-10-CM

## 2015-10-07 DIAGNOSIS — I10 Essential (primary) hypertension: Secondary | ICD-10-CM

## 2015-10-07 DIAGNOSIS — B009 Herpesviral infection, unspecified: Secondary | ICD-10-CM

## 2015-10-07 DIAGNOSIS — Z01818 Encounter for other preprocedural examination: Secondary | ICD-10-CM

## 2015-10-07 DIAGNOSIS — E039 Hypothyroidism, unspecified: Secondary | ICD-10-CM

## 2015-10-07 LAB — CBC AND DIFFERENTIAL
Basophils Absolute Automated: 0.02 10*3/uL (ref 0.00–0.20)
Basophils Automated: 0 %
Eosinophils Absolute Automated: 0.2 10*3/uL (ref 0.00–0.70)
Eosinophils Automated: 3 %
Hematocrit: 35.1 % — ABNORMAL LOW (ref 37.0–47.0)
Hgb: 11.1 g/dL — ABNORMAL LOW (ref 12.0–16.0)
Immature Granulocytes Absolute: 0.01 10*3/uL
Immature Granulocytes: 0 %
Lymphocytes Absolute Automated: 0.68 10*3/uL (ref 0.50–4.40)
Lymphocytes Automated: 10 %
MCH: 30.4 pg (ref 28.0–32.0)
MCHC: 31.6 g/dL — ABNORMAL LOW (ref 32.0–36.0)
MCV: 96.2 fL (ref 80.0–100.0)
MPV: 11.4 fL (ref 9.4–12.3)
Monocytes Absolute Automated: 0.58 10*3/uL (ref 0.00–1.20)
Monocytes: 8 %
Neutrophils Absolute: 5.54 10*3/uL (ref 1.80–8.10)
Neutrophils: 79 %
Nucleated RBC: 0 /100 WBC (ref 0–1)
Platelets: 349 10*3/uL (ref 140–400)
RBC: 3.65 10*6/uL — ABNORMAL LOW (ref 4.20–5.40)
RDW: 15 % (ref 12–15)
WBC: 7.03 10*3/uL (ref 3.50–10.80)

## 2015-10-07 LAB — COMPREHENSIVE METABOLIC PANEL
ALT: 15 U/L (ref 0–55)
AST (SGOT): 15 U/L (ref 5–34)
Albumin/Globulin Ratio: 1.4 (ref 0.9–2.2)
Albumin: 3.9 g/dL (ref 3.5–5.0)
Alkaline Phosphatase: 68 U/L (ref 37–106)
BUN: 15 mg/dL (ref 7.0–19.0)
Bilirubin, Total: 1.1 mg/dL (ref 0.1–1.2)
CO2: 26 mEq/L (ref 21–30)
Calcium: 8.8 mg/dL (ref 8.5–10.5)
Chloride: 106 mEq/L (ref 100–111)
Creatinine: 0.7 mg/dL (ref 0.4–1.5)
Globulin: 2.8 g/dL (ref 2.0–3.7)
Glucose: 83 mg/dL (ref 70–100)
Potassium: 4.2 mEq/L (ref 3.5–5.3)
Protein, Total: 6.7 g/dL (ref 6.0–8.3)
Sodium: 141 mEq/L (ref 135–146)

## 2015-10-07 LAB — URINALYSIS WITH MICROSCOPIC
Bilirubin, UA: NEGATIVE
Blood, UA: NEGATIVE
Glucose, UA: NEGATIVE
Ketones UA: NEGATIVE
Nitrite, UA: NEGATIVE
Specific Gravity UA: 1.03 (ref 1.001–1.035)
Urine pH: 6 (ref 5.0–8.0)
Urobilinogen, UA: 0.2 (ref 0.2–2.0)

## 2015-10-07 LAB — HEMOLYSIS INDEX: Hemolysis Index: 4 (ref 0–18)

## 2015-10-07 LAB — T4, FREE: T4 Free: 1.19 ng/dL (ref 0.70–1.48)

## 2015-10-07 LAB — MICROALBUMIN, RANDOM URINE
Urine Creatinine, Random: 227.1
Urine Microalbumin, Random: 12 (ref 0.0–30.0)
Urine Microalbumin/Creatinine Ratio: 5 ug/mg (ref 0–30)

## 2015-10-07 LAB — TSH: TSH: 0.91 u[IU]/mL (ref 0.35–4.94)

## 2015-10-07 LAB — GFR: EGFR: >60

## 2015-10-07 MED ORDER — VALACYCLOVIR HCL 500 MG PO TABS
500.0000 mg | ORAL_TABLET | Freq: Every day | ORAL | Status: DC | PRN
Start: 2015-10-07 — End: 2017-03-26

## 2015-10-07 NOTE — Progress Notes (Signed)
Have you seen any new specialists/physicians since you were last here? Dr. Suezanne Jacquet    Limb alert protocol reviewed?  Yes     Patient scheduled for Left cataract removal with Dr. Suezanne Jacquet on 11/04/2015.

## 2015-10-07 NOTE — Progress Notes (Addendum)
Subjective:       Patient ID: Kellie Fuentes is a 59 y.o. female.    HPI    The patient presents for medical evaluation in anticipation of cataract removal and lens implantation.  She will have the left eye done next month, and the right eye subsequently.    She has no acute complaints today.    Current medications are listed separately.  ALLERGIES: Penicillin.    Previous surgeries:  Remote history of breast surgery.  Remote history of knee surgery.  Laparoscopy.  Laser ablation of vulvar abnormalities.    Medical problems:  Diabetes mellitus.  She uses oral agents and Lantus insulin.  Hypothyroidism.  History of lupus, with primarily dermatologic manifestations.  She is followed by a rheumatologist.  Environmental ALLERGIES.  Hypertension.  Hyperlipidemia.  The patient was seen earlier this year for lower chest and abdominal discomfort.  This is felt to be due to irritable bowel syndrome.  The patient has a history of herpes simplex and uses Valtrex for suppression    The patient does not use tobacco and uses alcohol socially    Review of Systems   Constitutional: Negative for fever, chills, diaphoresis, activity change, appetite change, fatigue and unexpected weight change.   HENT: Negative.    Eyes: Negative for visual disturbance.   Respiratory: Negative for cough, shortness of breath, wheezing and stridor.    Cardiovascular: Negative for chest pain, palpitations and leg swelling.   Gastrointestinal: Positive for abdominal pain. Negative for nausea, diarrhea, constipation and blood in stool.   Endocrine: Negative.    Genitourinary: Negative.    Musculoskeletal: Negative.    Allergic/Immunologic: Negative.    Neurological: Negative.    All other systems reviewed and are negative.          Objective:    Physical Exam   Constitutional: She is oriented to person, place, and time.   The patient is a well-developed white female in no distress.  Blood pressure was 126/74, pulse 75, respirations 18, and temperature  98.4.  The patient is 5 feet 3 inches tall and weighs 172 pounds.   HENT:   Head: Normocephalic and atraumatic.   Right Ear: External ear normal.   Left Ear: External ear normal.   Mouth/Throat: Oropharynx is clear and moist.   Eyes: Conjunctivae are normal.   Neck: Normal range of motion. Neck supple. No thyromegaly present.   Cardiovascular: Normal rate, regular rhythm, normal heart sounds and intact distal pulses.    Pulmonary/Chest: Effort normal and breath sounds normal. She has no wheezes. She has no rales.   Abdominal: Soft. Bowel sounds are normal. She exhibits no mass. There is no tenderness.   Genitourinary:   Breast examination revealed no masses, no discharge, no skin changes, no nipple retraction, and no adenopathy.   Musculoskeletal: Normal range of motion.   Lymphadenopathy:     She has no cervical adenopathy.   Neurological: She is alert and oriented to person, place, and time. No cranial nerve deficit.   Skin: Skin is warm and dry.   Psychiatric: She has a normal mood and affect.           Assessment:       Cataracts.  Diabetes.  Hypothyroidism.  Lupus.  Hypertension.  History of hyperlipidemia.  Environmental ALLERGIES.        Plan:      Procedures    The patient will have an EKG and laboratory profile today.  Assuming nothing worrisome was  identified, the patient is cleared to proceed with surgery and anesthesia.  Her risk profile is acceptable.  Medications can be continued throughout the perioperative period, except for her Lantus insulin, which is normally given at high dosage in the evening.  The patient is advised to cut back her nighttime Lantus insulin to 15 units on the night before surgery.  The patient will also be told not to take her Amaryl the morning of surgery.    If questions or problems arise, please call me at my office.                                                           Helga Asbury,M.D.

## 2015-10-07 NOTE — Telephone Encounter (Signed)
Error - doctor filled during OV.

## 2015-10-09 LAB — HEMOGLOBIN A1C
Average Estimated Glucose: 76.7 mg/dL
Hemoglobin A1C: 4.3 % — ABNORMAL LOW (ref 4.6–5.9)

## 2015-10-10 ENCOUNTER — Other Ambulatory Visit (INDEPENDENT_AMBULATORY_CARE_PROVIDER_SITE_OTHER): Payer: Self-pay

## 2015-10-10 MED ORDER — ALPRAZOLAM 0.5 MG PO TABS
0.5000 mg | ORAL_TABLET | Freq: Two times a day (BID) | ORAL | Status: DC | PRN
Start: 2015-10-10 — End: 2015-12-29

## 2015-10-10 NOTE — Telephone Encounter (Signed)
Rx faxed

## 2015-10-11 ENCOUNTER — Other Ambulatory Visit (INDEPENDENT_AMBULATORY_CARE_PROVIDER_SITE_OTHER): Payer: Self-pay | Admitting: Internal Medicine

## 2015-10-17 ENCOUNTER — Other Ambulatory Visit (INDEPENDENT_AMBULATORY_CARE_PROVIDER_SITE_OTHER): Payer: Self-pay | Admitting: Internal Medicine

## 2015-12-29 ENCOUNTER — Other Ambulatory Visit (INDEPENDENT_AMBULATORY_CARE_PROVIDER_SITE_OTHER): Payer: Self-pay

## 2015-12-30 MED ORDER — ALPRAZOLAM 0.5 MG PO TABS
0.5000 mg | ORAL_TABLET | Freq: Two times a day (BID) | ORAL | Status: DC | PRN
Start: 2015-12-30 — End: 2016-06-07

## 2015-12-30 NOTE — Telephone Encounter (Signed)
Rx faxed

## 2016-01-07 ENCOUNTER — Telehealth (INDEPENDENT_AMBULATORY_CARE_PROVIDER_SITE_OTHER): Payer: Self-pay

## 2016-01-07 NOTE — Telephone Encounter (Signed)
Kellie Fuentes called in regard to PA status. Informed pharmacy that we were awaiting decision on whether doctor thinks there is a viable alternative for the Lantus or whether to proceed with the PA. Informed pharmacy that we would notify them when a decision was made.

## 2016-01-08 ENCOUNTER — Encounter (INDEPENDENT_AMBULATORY_CARE_PROVIDER_SITE_OTHER): Payer: Self-pay | Admitting: Internal Medicine

## 2016-01-08 NOTE — Telephone Encounter (Signed)
Dr. Einar Crow left message for patient to call office.

## 2016-01-09 ENCOUNTER — Other Ambulatory Visit (INDEPENDENT_AMBULATORY_CARE_PROVIDER_SITE_OTHER): Payer: Self-pay

## 2016-01-09 NOTE — Telephone Encounter (Signed)
Spoke to patient who stated that she had previously tried and failed Humulin N and Novolin. Patient states that she is well-controlled on the Lantus. Will proceed with PA.

## 2016-01-10 MED ORDER — LEVOTHYROXINE SODIUM 75 MCG PO TABS
75.0000 ug | ORAL_TABLET | Freq: Every day | ORAL | Status: DC
Start: 2016-01-10 — End: 2016-03-16

## 2016-01-15 NOTE — Telephone Encounter (Signed)
Left message for patient that insurance company denied PA for Lantus. Dr. Einar Crow is willing to proceed with an appeal, but would like patient to schedule OV to discuss history and plan going forward. Instructed patient to call with any questions.

## 2016-01-16 ENCOUNTER — Encounter (INDEPENDENT_AMBULATORY_CARE_PROVIDER_SITE_OTHER): Payer: Self-pay

## 2016-01-19 ENCOUNTER — Encounter (INDEPENDENT_AMBULATORY_CARE_PROVIDER_SITE_OTHER): Payer: Self-pay

## 2016-01-23 ENCOUNTER — Ambulatory Visit (INDEPENDENT_AMBULATORY_CARE_PROVIDER_SITE_OTHER): Payer: Commercial Managed Care - POS | Admitting: Internal Medicine

## 2016-01-23 ENCOUNTER — Encounter (INDEPENDENT_AMBULATORY_CARE_PROVIDER_SITE_OTHER): Payer: Self-pay | Admitting: Internal Medicine

## 2016-01-23 VITALS — BP 119/57 | HR 75 | Temp 98.1°F | Ht 63.0 in

## 2016-01-23 DIAGNOSIS — Z1159 Encounter for screening for other viral diseases: Secondary | ICD-10-CM

## 2016-01-23 DIAGNOSIS — E138 Other specified diabetes mellitus with unspecified complications: Secondary | ICD-10-CM

## 2016-01-23 LAB — HEMOGLOBIN A1C
Average Estimated Glucose: 93.9 mg/dL
Hemoglobin A1C: 4.9 % (ref 4.6–5.9)

## 2016-01-23 LAB — HEPATITIS C ANTIBODY: Hepatitis C, AB: NONREACTIVE

## 2016-01-23 NOTE — Progress Notes (Signed)
Subjective:      Patient ID: Kellie Fuentes is a 60 y.o. female          HPI     The patient presents for follow-up of diabetes.  She has had diabetes for any excess of 15 years and has been maintained on Lantus insulin as part of her regimen.  She has done extremely well.  Recently, her insurance company is denying the Lantus and advocating a change to another long-acting insulin.  The patient does not wish to do this and presents to review her history.  The patient takes her blood sugar morning and evening.  She will give herself between 30 and 50 units of Lantus at bedtime depending on the readings.  Rarely, she will take Lantus in the morning.  She has done very well on this regimen.  She does not require short acting insulins.    Review of Systems   All other systems reviewed and are negative.         BP 119/57 mmHg  Pulse 75  Temp(Src) 98.1 F (36.7 C) (Oral)  Ht 1.6 m (5\' 3" )     Objective:     Physical Exam   Constitutional: She appears well-developed and well-nourished. No distress.   HENT:   Head: Normocephalic and atraumatic.   Mouth/Throat: Oropharynx is clear and moist.   Eyes: Conjunctivae are normal.   Neck: Normal range of motion. Neck supple.   Cardiovascular: Normal rate and regular rhythm.    Pulmonary/Chest: Effort normal and breath sounds normal.   Vitals reviewed.         Assessment:      Diabetes mellitus.      Plan:     We will check a hemoglobin A1c.  I will try to provide documentation to her insurance company to avoid changing medication.  We will also check a hepatitis C antibody at the request of her gastroenterologist.  She denies high risk exposures.    Lenord Fellers, MD

## 2016-01-23 NOTE — Progress Notes (Signed)
Have you seen any new specialists/physicians since you were last here? Dr. Arlis Porta    Limb alert protocol reviewed?  Yes     Patient's insurance denied Lantus PA and patient needs to discuss with Dr. Einar Crow.

## 2016-01-24 ENCOUNTER — Encounter (INDEPENDENT_AMBULATORY_CARE_PROVIDER_SITE_OTHER): Payer: Self-pay | Admitting: Internal Medicine

## 2016-01-26 ENCOUNTER — Telehealth (INDEPENDENT_AMBULATORY_CARE_PROVIDER_SITE_OTHER): Payer: Self-pay

## 2016-01-26 NOTE — Telephone Encounter (Signed)
Left message for patient in regard to unread MyChart message sent by Dr. Einar Crow on 01/24/2016. Instructed patient to call back with questions/concerns.

## 2016-01-27 ENCOUNTER — Encounter (INDEPENDENT_AMBULATORY_CARE_PROVIDER_SITE_OTHER): Payer: Self-pay

## 2016-01-27 ENCOUNTER — Encounter (INDEPENDENT_AMBULATORY_CARE_PROVIDER_SITE_OTHER): Payer: Self-pay | Admitting: Internal Medicine

## 2016-01-27 NOTE — Progress Notes (Signed)
Appeal letter for Lantus faxed to [employer] at [phone].

## 2016-01-29 ENCOUNTER — Encounter (INDEPENDENT_AMBULATORY_CARE_PROVIDER_SITE_OTHER): Payer: Self-pay

## 2016-01-29 ENCOUNTER — Telehealth (INDEPENDENT_AMBULATORY_CARE_PROVIDER_SITE_OTHER): Payer: Self-pay

## 2016-01-29 NOTE — Telephone Encounter (Signed)
Left message for patient in regard to MyChart message. Spoke to gentleman on the phone who said he would tell patient to check her MyChart account.

## 2016-02-05 ENCOUNTER — Other Ambulatory Visit (INDEPENDENT_AMBULATORY_CARE_PROVIDER_SITE_OTHER): Payer: Self-pay

## 2016-02-05 MED ORDER — IBUPROFEN 800 MG PO TABS
800.0000 mg | ORAL_TABLET | Freq: Four times a day (QID) | ORAL | Status: DC | PRN
Start: 2016-02-05 — End: 2016-06-07

## 2016-02-12 ENCOUNTER — Other Ambulatory Visit (INDEPENDENT_AMBULATORY_CARE_PROVIDER_SITE_OTHER): Payer: Self-pay

## 2016-02-12 MED ORDER — IRBESARTAN 75 MG PO TABS
75.0000 mg | ORAL_TABLET | Freq: Every day | ORAL | Status: DC
Start: 2016-02-12 — End: 2016-08-14

## 2016-02-26 ENCOUNTER — Other Ambulatory Visit (INDEPENDENT_AMBULATORY_CARE_PROVIDER_SITE_OTHER): Payer: Self-pay

## 2016-02-26 MED ORDER — BASAGLAR KWIKPEN 100 UNIT/ML SC SOPN
60.0000 [IU] | PEN_INJECTOR | Freq: Every day | SUBCUTANEOUS | Status: DC
Start: 2016-02-26 — End: 2016-06-29

## 2016-03-16 ENCOUNTER — Other Ambulatory Visit (INDEPENDENT_AMBULATORY_CARE_PROVIDER_SITE_OTHER): Payer: Self-pay

## 2016-03-16 MED ORDER — GLIMEPIRIDE 4 MG PO TABS
4.0000 mg | ORAL_TABLET | Freq: Two times a day (BID) | ORAL | Status: DC
Start: 2016-03-16 — End: 2016-12-03

## 2016-03-16 MED ORDER — LEVOTHYROXINE SODIUM 75 MCG PO TABS
75.0000 ug | ORAL_TABLET | Freq: Every day | ORAL | Status: DC
Start: 2016-03-16 — End: 2016-12-23

## 2016-05-18 ENCOUNTER — Ambulatory Visit (INDEPENDENT_AMBULATORY_CARE_PROVIDER_SITE_OTHER): Payer: Commercial Managed Care - POS | Admitting: Internal Medicine

## 2016-05-18 ENCOUNTER — Encounter (INDEPENDENT_AMBULATORY_CARE_PROVIDER_SITE_OTHER): Payer: Self-pay | Admitting: Internal Medicine

## 2016-05-18 VITALS — BP 120/71 | HR 80 | Temp 99.2°F | Ht 63.0 in | Wt 169.6 lb

## 2016-05-18 DIAGNOSIS — R109 Unspecified abdominal pain: Secondary | ICD-10-CM

## 2016-05-18 DIAGNOSIS — R5383 Other fatigue: Secondary | ICD-10-CM

## 2016-05-18 DIAGNOSIS — E138 Other specified diabetes mellitus with unspecified complications: Secondary | ICD-10-CM

## 2016-05-18 LAB — URINALYSIS WITH MICROSCOPIC
Blood, UA: NEGATIVE
Glucose, UA: NEGATIVE
Ketones UA: NEGATIVE
Leukocyte Esterase, UA: NEGATIVE
Nitrite, UA: NEGATIVE
Specific Gravity UA: >1.035 — AB (ref 1.001–1.035)
Urine pH: 6 (ref 5.0–8.0)
Urobilinogen, UA: 0.2 (ref 0.2–2.0)

## 2016-05-18 LAB — CBC AND DIFFERENTIAL
Basophils Absolute Automated: 0.02 10*3/uL (ref 0.00–0.20)
Basophils Automated: 0.4 %
Eosinophils Absolute Automated: 0.17 10*3/uL (ref 0.00–0.70)
Eosinophils Automated: 3.2 %
Hematocrit: 32.5 % — ABNORMAL LOW (ref 37.0–47.0)
Hgb: 10.2 g/dL — ABNORMAL LOW (ref 12.0–16.0)
Immature Granulocytes Absolute: 0.02 10*3/uL
Immature Granulocytes: 0.4 %
Lymphocytes Absolute Automated: 0.67 10*3/uL (ref 0.50–4.40)
Lymphocytes Automated: 12.8 %
MCH: 30.4 pg (ref 28.0–32.0)
MCHC: 31.4 g/dL — ABNORMAL LOW (ref 32.0–36.0)
MCV: 96.7 fL (ref 80.0–100.0)
MPV: 10.2 fL (ref 9.4–12.3)
Monocytes Absolute Automated: 0.47 10*3/uL (ref 0.00–1.20)
Monocytes: 9 %
Neutrophils Absolute: 3.9 10*3/uL (ref 1.80–8.10)
Neutrophils: 74.2 %
Nucleated RBC: 0 /100 WBC (ref 0.0–1.0)
Platelets: 405 10*3/uL — ABNORMAL HIGH (ref 140–400)
RBC: 3.36 10*6/uL — ABNORMAL LOW (ref 4.20–5.40)
RDW: 16 % — ABNORMAL HIGH (ref 12–15)
WBC: 5.25 10*3/uL (ref 3.50–10.80)

## 2016-05-18 LAB — HEMOGLOBIN A1C
Average Estimated Glucose: 85.3 mg/dL
Hemoglobin A1C: 4.6 % (ref 4.6–5.9)

## 2016-05-18 NOTE — Progress Notes (Signed)
Subjective:      Patient ID: Kellie Fuentes is a 60 y.o. female          HPI     The patient presents for follow-up.  Problems include the following:  History of lupus.  Hypothyroidism.  Diabetes mellitus.  History of hypertension.  Environmental ALLERGIES.    The patient has had ongoing left upper quadrant abdominal discomfort.  It is a sharp stabbing pain just beneath the rib cage.  It occurs episodically, but can be severe.  It is worse when she is constipated, but does not improve after a bowel movement.  She has been seeing the gastroenterologist.  An abdominal CT done in May 2016 showed nothing of concern.  There were some nonspecific fluid around the spleen.  Colonoscopy is less than-year-old.  She has been treated symptomatically but has concerns about occult malignancy.  She has done reading on the Internet and is concerned about liver disease or some other morbid condition.  She mentions fever for the last several days without other associated symptoms.  She seems to have a generalized sense of  non-wellness.    Review of Systems   All other systems reviewed and are negative.         BP 120/71 mmHg  Pulse 80  Temp(Src) 99.2 F (37.3 C) (Oral)  Ht 1.6 m (5\' 3" )  Wt 76.93 kg (169 lb 9.6 oz)  BMI 30.05 kg/m2     Objective:     Physical Exam   Constitutional: She appears well-developed and well-nourished. No distress.   HENT:   Head: Normocephalic and atraumatic.   Mouth/Throat: Oropharynx is clear and moist.   Eyes: Conjunctivae are normal. No scleral icterus.   Neck: Normal range of motion. Neck supple.   Cardiovascular: Normal rate and regular rhythm.    Pulmonary/Chest: Effort normal and breath sounds normal. She has no wheezes. She has no rales.   Abdominal: Soft. Bowel sounds are normal. She exhibits no mass. There is no tenderness.   The patient has some mild discomfort in the left upper quadrant, but it is different from her chronic pain.  I did not feel a discrete mass.   Vitals reviewed.          Assessment:   Recurrent abdominal pain.  This is somewhat suggestive of splenic flexure syndrome.  However, with more worrisome pathology needs to be investigated.  Other problems as noted.         Plan:     We will check appropriate laboratory studies.  We will start with an abdominal ultrasound, specifically to address her liver concerns, but proceed accordingly depending on the initial results.  The patient is quite concerned about this problem, and I think it is important to try to reach closure, both regarding diagnosis and hopefully excluding more worrisome pathology.  I also recommended that she see her rheumatologist, since a lupus flare could present in this manner.    Lenord Fellers, MD

## 2016-05-18 NOTE — Progress Notes (Signed)
Have you seen any specialists/other providers since your last visit with us?    Yes - Dr. Ranard      Limb alert protocol reviewed?      Yes

## 2016-05-19 ENCOUNTER — Encounter (INDEPENDENT_AMBULATORY_CARE_PROVIDER_SITE_OTHER): Payer: Self-pay | Admitting: Internal Medicine

## 2016-05-19 LAB — COMPREHENSIVE METABOLIC PANEL
ALT: 16 U/L (ref 0–55)
AST (SGOT): 14 U/L (ref 5–34)
Albumin/Globulin Ratio: 1.4 (ref 0.9–2.2)
Albumin: 3.8 g/dL (ref 3.5–5.0)
Alkaline Phosphatase: 69 U/L (ref 37–106)
BUN: 15 mg/dL (ref 7.0–19.0)
Bilirubin, Total: 1.3 mg/dL — ABNORMAL HIGH (ref 0.1–1.2)
CO2: 27 mEq/L (ref 21–30)
Calcium: 8.9 mg/dL (ref 8.5–10.5)
Chloride: 106 mEq/L (ref 100–111)
Creatinine: 0.7 mg/dL (ref 0.4–1.5)
Globulin: 2.8 g/dL (ref 2.0–3.7)
Glucose: 193 mg/dL — ABNORMAL HIGH (ref 70–100)
Potassium: 4.7 mEq/L (ref 3.5–5.1)
Protein, Total: 6.6 g/dL (ref 6.0–8.3)
Sodium: 139 mEq/L (ref 135–146)

## 2016-05-19 LAB — HEMOLYSIS INDEX: Hemolysis Index: 3 (ref 0–18)

## 2016-05-19 LAB — GFR: EGFR: >60

## 2016-05-19 LAB — TSH: TSH: 0.85 u[IU]/mL (ref 0.35–4.94)

## 2016-05-21 ENCOUNTER — Telehealth (INDEPENDENT_AMBULATORY_CARE_PROVIDER_SITE_OTHER): Payer: Self-pay

## 2016-05-21 NOTE — Telephone Encounter (Signed)
Informed patient of unread MyChart message sent by Dr. Einar Crow on 05/19/2016. Contents of message discussed and patient verbalized understanding.

## 2016-05-26 ENCOUNTER — Other Ambulatory Visit: Payer: Self-pay | Admitting: Internal Medicine

## 2016-05-28 ENCOUNTER — Telehealth (INDEPENDENT_AMBULATORY_CARE_PROVIDER_SITE_OTHER): Payer: Self-pay | Admitting: Internal Medicine

## 2016-05-28 ENCOUNTER — Other Ambulatory Visit: Payer: Self-pay | Admitting: Internal Medicine

## 2016-05-28 NOTE — Telephone Encounter (Signed)
The patient was called and reassured about her CT scan.  I will see her back in the office to discuss where we go from here.    Katira Dumais,M.D.

## 2016-05-31 ENCOUNTER — Encounter (INDEPENDENT_AMBULATORY_CARE_PROVIDER_SITE_OTHER): Payer: Self-pay

## 2016-06-03 ENCOUNTER — Ambulatory Visit (INDEPENDENT_AMBULATORY_CARE_PROVIDER_SITE_OTHER): Payer: Commercial Managed Care - POS | Admitting: Internal Medicine

## 2016-06-03 ENCOUNTER — Encounter (INDEPENDENT_AMBULATORY_CARE_PROVIDER_SITE_OTHER): Payer: Self-pay | Admitting: Internal Medicine

## 2016-06-03 ENCOUNTER — Telehealth (INDEPENDENT_AMBULATORY_CARE_PROVIDER_SITE_OTHER): Payer: Self-pay | Admitting: Internal Medicine

## 2016-06-03 VITALS — BP 118/72 | HR 74 | Temp 98.0°F | Resp 17 | Ht 61.5 in | Wt 170.0 lb

## 2016-06-03 DIAGNOSIS — D473 Essential (hemorrhagic) thrombocythemia: Secondary | ICD-10-CM

## 2016-06-03 DIAGNOSIS — G479 Sleep disorder, unspecified: Secondary | ICD-10-CM

## 2016-06-03 DIAGNOSIS — R109 Unspecified abdominal pain: Secondary | ICD-10-CM

## 2016-06-03 DIAGNOSIS — R5383 Other fatigue: Secondary | ICD-10-CM

## 2016-06-03 DIAGNOSIS — D75839 Thrombocytosis, unspecified: Secondary | ICD-10-CM

## 2016-06-03 DIAGNOSIS — D649 Anemia, unspecified: Secondary | ICD-10-CM

## 2016-06-03 DIAGNOSIS — M1990 Unspecified osteoarthritis, unspecified site: Secondary | ICD-10-CM

## 2016-06-03 NOTE — Progress Notes (Signed)
Have you seen any specialists/other providers since your last visit with us?   NO      Limb alert protocol reviewed?      YES

## 2016-06-03 NOTE — Telephone Encounter (Signed)
Pt was able to get an appt with Dr Jaquelyn Bitter (rhumatologist)  Tomorrow morning.

## 2016-06-03 NOTE — Progress Notes (Signed)
Subjective:      Patient ID: Kellie Fuentes is a 60 y.o. female          HPI     The patient presents for follow-up.  She describes fatigue, diffuse arthralgias, and malaise.  She also continues with left-sided episodic abdominal discomfort.  She presents to discuss the results of recent evaluation.    Review of Systems   All other systems reviewed and are negative.         BP 118/72 mmHg  Pulse 74  Temp(Src) 98 F (36.7 C) (Oral)  Resp 17  Ht 1.562 m (5' 1.5")  Wt 77.111 kg (170 lb)  BMI 31.60 kg/m2  SpO2 96%     Objective:     Physical Exam   Constitutional: She appears well-developed and well-nourished. No distress.   HENT:   Head: Normocephalic and atraumatic.   Mouth/Throat: Oropharynx is clear and moist.   Eyes: Conjunctivae are normal.   Neck: Normal range of motion. Neck supple.   Cardiovascular: Normal rate and regular rhythm.    Pulmonary/Chest: Effort normal.   Abdominal: Soft. Bowel sounds are normal.   Vitals reviewed.        Assessment:   Constitutional symptoms as noted.        Plan:     I reviewed the imaging studies which show a spleen at the upper limits of normal size and a slightly enlarged liver, presumably due to hepatic steatosis.  Nothing else worrisome was identified.  Lab work was unremarkable except for a mild anemia and a mild thrombocytosis.  I told the patient that I believe the hematologic abnormalities are probably mild, and reactive and that I am suspicious that her lupus may be active.  I also reviewed her sleep situation and the patient describes symptoms of sleep deprivation and possibly even narcolepsy.  She will see her rheumatologist and I encouraged her to think about sleep evaluation to try to nail down a formal sleep diagnosis.  I will send all of her data to the rheumatologist and try to coordinate with him.    Lenord Fellers, MD

## 2016-06-04 ENCOUNTER — Encounter (INDEPENDENT_AMBULATORY_CARE_PROVIDER_SITE_OTHER): Payer: Self-pay | Admitting: Internal Medicine

## 2016-06-07 ENCOUNTER — Other Ambulatory Visit (INDEPENDENT_AMBULATORY_CARE_PROVIDER_SITE_OTHER): Payer: Self-pay

## 2016-06-07 MED ORDER — ALPRAZOLAM 0.5 MG PO TABS
0.5000 mg | ORAL_TABLET | Freq: Two times a day (BID) | ORAL | Status: DC | PRN
Start: 2016-06-07 — End: 2016-10-01

## 2016-06-07 MED ORDER — IBUPROFEN 800 MG PO TABS
800.0000 mg | ORAL_TABLET | Freq: Four times a day (QID) | ORAL | Status: DC | PRN
Start: 2016-06-07 — End: 2016-10-05

## 2016-06-07 NOTE — Telephone Encounter (Signed)
Rx phoned in.   

## 2016-06-29 ENCOUNTER — Other Ambulatory Visit (INDEPENDENT_AMBULATORY_CARE_PROVIDER_SITE_OTHER): Payer: Self-pay

## 2016-06-29 MED ORDER — BASAGLAR KWIKPEN 100 UNIT/ML SC SOPN
60.0000 [IU] | PEN_INJECTOR | Freq: Every day | SUBCUTANEOUS | Status: AC
Start: 2016-06-29 — End: ?

## 2016-08-14 ENCOUNTER — Other Ambulatory Visit (INDEPENDENT_AMBULATORY_CARE_PROVIDER_SITE_OTHER): Payer: Self-pay | Admitting: Internal Medicine

## 2016-08-28 ENCOUNTER — Other Ambulatory Visit (INDEPENDENT_AMBULATORY_CARE_PROVIDER_SITE_OTHER): Payer: Self-pay | Admitting: Internal Medicine

## 2016-08-30 ENCOUNTER — Other Ambulatory Visit (INDEPENDENT_AMBULATORY_CARE_PROVIDER_SITE_OTHER): Payer: Self-pay

## 2016-09-03 ENCOUNTER — Telehealth (INDEPENDENT_AMBULATORY_CARE_PROVIDER_SITE_OTHER): Payer: Self-pay | Admitting: Internal Medicine

## 2016-09-03 NOTE — Telephone Encounter (Signed)
Patient has changed pharmacy to [facility] on [address].    She need refills on   ALPRAZolam (XANAX) 0.5 MG tablet  ibuprofen (ADVIL,MOTRIN) 800 MG tablet  levothyroxine (SYNTHROID, LEVOTHROID) 75 MCG tablet    And Tramadol

## 2016-09-03 NOTE — Telephone Encounter (Signed)
Refill request sent to [facility], but Tramadol not on patient's med list??

## 2016-09-18 ENCOUNTER — Encounter (INDEPENDENT_AMBULATORY_CARE_PROVIDER_SITE_OTHER): Payer: Self-pay | Admitting: Internal Medicine

## 2016-09-22 ENCOUNTER — Telehealth (INDEPENDENT_AMBULATORY_CARE_PROVIDER_SITE_OTHER): Payer: Self-pay

## 2016-09-22 NOTE — Telephone Encounter (Signed)
Left message for patient in regard to unread MyChart message sent by Dr. Einar Crow on 09/18/2016. Instructed patient to call back with questions/concerns.

## 2016-09-24 ENCOUNTER — Ambulatory Visit: Payer: Self-pay

## 2016-09-30 ENCOUNTER — Encounter (INDEPENDENT_AMBULATORY_CARE_PROVIDER_SITE_OTHER): Payer: Self-pay

## 2016-09-30 ENCOUNTER — Ambulatory Visit (FREE_STANDING_LABORATORY_FACILITY): Payer: Commercial Managed Care - POS | Admitting: Internal Medicine

## 2016-09-30 VITALS — BP 139/72 | HR 80 | Temp 97.9°F | Resp 17 | Ht 62.0 in | Wt 166.0 lb

## 2016-09-30 DIAGNOSIS — I10 Essential (primary) hypertension: Secondary | ICD-10-CM

## 2016-09-30 DIAGNOSIS — E138 Other specified diabetes mellitus with unspecified complications: Secondary | ICD-10-CM

## 2016-09-30 DIAGNOSIS — Z01818 Encounter for other preprocedural examination: Secondary | ICD-10-CM

## 2016-09-30 LAB — CBC AND DIFFERENTIAL
Absolute NRBC: 0 10*3/uL
Basophils Absolute Automated: 0.02 10*3/uL (ref 0.00–0.20)
Basophils Automated: 0.4 %
Eosinophils Absolute Automated: 0.11 10*3/uL (ref 0.00–0.70)
Eosinophils Automated: 1.9 %
Hematocrit: 35.2 % — ABNORMAL LOW (ref 37.0–47.0)
Hgb: 11.4 g/dL — ABNORMAL LOW (ref 12.0–16.0)
Immature Granulocytes Absolute: 0.02 10*3/uL
Immature Granulocytes: 0.4 %
Lymphocytes Absolute Automated: 0.62 10*3/uL (ref 0.50–4.40)
Lymphocytes Automated: 11 %
MCH: 30.1 pg (ref 28.0–32.0)
MCHC: 32.4 g/dL (ref 32.0–36.0)
MCV: 92.9 fL (ref 80.0–100.0)
MPV: 11 fL (ref 9.4–12.3)
Monocytes Absolute Automated: 0.41 10*3/uL (ref 0.00–1.20)
Monocytes: 7.3 %
Neutrophils Absolute: 4.47 10*3/uL (ref 1.80–8.10)
Neutrophils: 79 %
Nucleated RBC: 0 /100 WBC (ref 0.0–1.0)
Platelets: 356 10*3/uL (ref 140–400)
RBC: 3.79 10*6/uL — ABNORMAL LOW (ref 4.20–5.40)
RDW: 13 % (ref 12–15)
WBC: 5.65 10*3/uL (ref 3.50–10.80)

## 2016-09-30 LAB — HEMOGLOBIN A1C
Average Estimated Glucose: 105.4 mg/dL
Hemoglobin A1C: 5.3 % (ref 4.6–5.9)

## 2016-09-30 LAB — GFR: EGFR: >60

## 2016-09-30 LAB — BASIC METABOLIC PANEL
BUN: 13 mg/dL (ref 7.0–19.0)
CO2: 27 mEq/L (ref 21–29)
Calcium: 9.6 mg/dL (ref 8.5–10.5)
Chloride: 103 mEq/L (ref 100–111)
Creatinine: 0.7 mg/dL (ref 0.4–1.5)
Glucose: 120 mg/dL — ABNORMAL HIGH (ref 70–100)
Potassium: 4 mEq/L (ref 3.5–5.1)
Sodium: 138 mEq/L (ref 136–145)

## 2016-09-30 LAB — HEMOLYSIS INDEX: Hemolysis Index: 4 (ref 0–18)

## 2016-09-30 NOTE — Progress Notes (Signed)
Have you seen any specialists/other providers since your last visit with [place]?    YES  Dr. Toniann Fail- plastic       Limb alert protocol reviewed?      YES

## 2016-10-01 ENCOUNTER — Other Ambulatory Visit (INDEPENDENT_AMBULATORY_CARE_PROVIDER_SITE_OTHER): Payer: Self-pay

## 2016-10-01 MED ORDER — ALPRAZOLAM 0.5 MG PO TABS
0.5000 mg | ORAL_TABLET | Freq: Every evening | ORAL | 1 refills | Status: DC | PRN
Start: 2016-10-01 — End: 2017-03-01

## 2016-10-01 NOTE — Progress Notes (Signed)
Subjective:      Patient ID: Kellie Fuentes is a 60 y.o. female          HPI     The patient presents for medical evaluation in anticipation of abdominoplasty.  She is not having any current abdominal symptoms.  She did present earlier this year with abdominal discomfort, but nothing worrisome was identified.    The patient has no acute complaints today.    Current medications and medication ALLERGIES are listed separately.  It should be noted that she uses Xanax approximately twice a week and takes the medication when she wakes up during the night in order to go back to sleep.  She uses the medication responsibly.    It is noted that she uses tramadol, even though it is listed under her ALLERGIES.  She has a skin reaction to the tramadol, but does get some pain relief with the medication and therefore takes medication, despite this problem.    She uses ibuprofen 800 mg every morning but only once a day for arthralgias.    Previous surgeries:  History of knee surgery.  History of diagnostic laparoscopy.  History of reduction mammoplasty.  Bilateral cataracts.  History of removal of a cyst from the chest wall, with benign pathology.  Laser ablation of vulvar abnormalities.    Medical problems:  Diabetes mellitus, with excellent control on oral agents and long-acting insulin.  Hypothyroidism.  History of lupus, with arthralgias and dermatologic manifestations.  This is followed by a rheumatologist.  History of environmental ALLERGIES.  History of hypertension.  History of hyperlipidemia.  History of herpes simplex, with Valtrex used for suppression.  The patient has some type of history of daytime sleepiness and her rheumatologist prescribed Nuvigil at one point, but she is not taking the medication now for insurance reasons.  She did not have formal sleep evaluation.    The patient does not use tobacco and uses alcohol socially.    Review of Systems   Constitutional: Negative for activity change, appetite  change, chills, diaphoresis, fatigue, fever and unexpected weight change.   HENT: Negative.    Eyes: Negative.    Respiratory: Negative for cough, shortness of breath, wheezing and stridor.    Cardiovascular: Negative for chest pain, palpitations and leg swelling.   Gastrointestinal: Negative for abdominal pain, blood in stool, constipation, diarrhea and nausea.   Endocrine: Negative.    Genitourinary: Negative.    Musculoskeletal: Positive for arthralgias.   Allergic/Immunologic: Negative.    Neurological: Negative.    All other systems reviewed and are negative.         BP 139/72   Pulse 80   Temp 97.9 F (36.6 C) (Oral)   Resp 17   Ht 1.575 m (5\' 2" )   Wt 75.3 kg (166 lb)   LMP  (LMP Unknown)   SpO2 97%   BMI 30.36 kg/m     The following sections were reviewed this encounter by the provider:   Allergies  Meds  Problems         Objective:     Physical Exam   Constitutional: She is oriented to person, place, and time.   The patient is a well-developed white female in no distress..  Blood pressure was 139/72, pulse 80, respirations 18, and temperature 97.9.  The patient is 5 feet 2 inches tall and weighs 166 pounds.  O2 saturation was 97 percent on room air   HENT:   Head: Normocephalic and atraumatic.  Mouth/Throat: Oropharynx is clear and moist.   Eyes: Conjunctivae are normal.   Neck: Normal range of motion. Neck supple. No thyromegaly present.   Cardiovascular: Normal rate, regular rhythm, normal heart sounds and intact distal pulses.    The patient has a soft systolic murmur at the heart base and left sternal border with benign characteristics.  This has been heard in the past.       Pulmonary/Chest: Effort normal and breath sounds normal. She has no wheezes. She has no rales.   Abdominal: Soft. Bowel sounds are normal. She exhibits no mass. There is no tenderness.   Musculoskeletal: Normal range of motion. She exhibits no edema or tenderness.   Lymphadenopathy:     She has no cervical adenopathy.    Neurological: She is alert and oriented to person, place, and time. She has normal reflexes. No cranial nerve deficit.   Skin: Skin is warm and dry.   Psychiatric: She has a normal mood and affect.        Assessment:   Abdominal wall abnormalities, for abdominoplasty.  Diabetes mellitus.  Lupus.  Hypertension.  History of hyperlipidemia.  History of hypothyroidism.  Ill-defined daytime somnolence.  History of herpes simplex infection.          Plan:     The patient will have an EKG and laboratory profile today.  Assuming nothing worrisome is identified, the patient is cleared to proceed with surgery and anesthesia.  Her risk profile is acceptable.  Nonsteroidal anti-inflammatory drugs should be stopped at least 5 days preoperatively and preferably 7 days preoperatively.  Her Amaryl should be held the morning of surgery and not restarted until she is eating adequately.  Regarding her long-acting insulin, the procedure is not until 1 PM and I will therefore recommend that she decrease the dosage to 10 units the night before surgery.  Other medications can be continued throughout the perioperative period.    Anesthesia should be aware that there is some type of question regarding daytime sleepiness, but it is not currently being treated and she has been able to work during the day without difficulty.    If questions or problems arise, please call me at my office.                                                           Aaliyha Mumford,M.D.    Lenord Fellers, MD

## 2016-10-01 NOTE — Telephone Encounter (Signed)
Rx faxed

## 2016-10-05 ENCOUNTER — Other Ambulatory Visit (INDEPENDENT_AMBULATORY_CARE_PROVIDER_SITE_OTHER): Payer: Self-pay | Admitting: Internal Medicine

## 2016-10-11 ENCOUNTER — Encounter (INDEPENDENT_AMBULATORY_CARE_PROVIDER_SITE_OTHER): Payer: Self-pay | Admitting: Internal Medicine

## 2016-10-15 ENCOUNTER — Ambulatory Visit: Payer: Self-pay

## 2016-10-15 ENCOUNTER — Ambulatory Visit
Admission: RE | Admit: 2016-10-15 | Discharge: 2016-10-15 | Disposition: A | Discharge status: Home / Self Care | Payer: Self-pay | Source: Ambulatory Visit

## 2016-10-15 ENCOUNTER — Ambulatory Visit: Payer: Self-pay | Admitting: Student in an Organized Health Care Education/Training Program

## 2016-10-15 ENCOUNTER — Encounter: Admission: RE | Disposition: A | Discharge status: Home / Self Care | Payer: Self-pay | Source: Ambulatory Visit

## 2016-10-15 DIAGNOSIS — Z794 Long term (current) use of insulin: Secondary | ICD-10-CM | POA: Insufficient documentation

## 2016-10-15 DIAGNOSIS — E119 Type 2 diabetes mellitus without complications: Secondary | ICD-10-CM | POA: Insufficient documentation

## 2016-10-15 DIAGNOSIS — E65 Localized adiposity: Secondary | ICD-10-CM | POA: Insufficient documentation

## 2016-10-15 DIAGNOSIS — Z411 Encounter for cosmetic surgery: Secondary | ICD-10-CM | POA: Insufficient documentation

## 2016-10-15 DIAGNOSIS — Z880 Allergy status to penicillin: Secondary | ICD-10-CM | POA: Insufficient documentation

## 2016-10-15 DIAGNOSIS — M329 Systemic lupus erythematosus, unspecified: Secondary | ICD-10-CM | POA: Insufficient documentation

## 2016-10-15 DIAGNOSIS — L574 Cutis laxa senilis: Secondary | ICD-10-CM | POA: Insufficient documentation

## 2016-10-15 LAB — GLUCOSE WHOLE BLOOD - POCT
Whole Blood Glucose POCT: 77 mg/dL (ref 70–100)
Whole Blood Glucose POCT: 107 mg/dL — ABNORMAL HIGH (ref 70–100)

## 2016-10-15 SURGERY — ABDOMINOPLASTY, LIPOSUCTION (COSMETIC)
Anesthesia: Anesthesia General | Site: Abdomen | Wound class: Clean

## 2016-10-15 MED ORDER — EPHEDRINE SULFATE 50 MG/ML IJ/IV SOLN (WRAP)
Status: DC | PRN
Start: 2016-10-15 — End: 2016-10-15
  Administered 2016-10-15: 10 mg via INTRAVENOUS
  Administered 2016-10-15 (×2): 5 mg via INTRAVENOUS

## 2016-10-15 MED ORDER — PROMETHAZINE HCL 25 MG/ML IJ SOLN
INTRAMUSCULAR | Status: AC
Start: 2016-10-15 — End: 2016-10-15
  Administered 2016-10-15: 6.25 mg via INTRAVENOUS
  Filled 2016-10-15: qty 1

## 2016-10-15 MED ORDER — LIDOCAINE HCL 2 % IJ SOLN
INTRAMUSCULAR | Status: AC
Start: 2016-10-15 — End: ?
  Filled 2016-10-15: qty 40

## 2016-10-15 MED ORDER — BUPIVACAINE-EPINEPHRINE (PF) 0.25% -1:200000 IJ SOLN
INTRAMUSCULAR | Status: DC | PRN
Start: 2016-10-15 — End: 2016-10-15
  Administered 2016-10-15: 10 mL via INTRAMUSCULAR

## 2016-10-15 MED ORDER — ROCURONIUM BROMIDE 10 MG/ML IV SOLN (WRAP)
INTRAVENOUS | Status: DC | PRN
Start: 2016-10-15 — End: 2016-10-15
  Administered 2016-10-15: 50 mg via INTRAVENOUS

## 2016-10-15 MED ORDER — OXYCODONE-ACETAMINOPHEN 5-325 MG PO TABS
2.0000 | ORAL_TABLET | ORAL | 0 refills | Status: DC | PRN
Start: 2016-10-15 — End: 2017-06-15

## 2016-10-15 MED ORDER — ONDANSETRON HCL 4 MG/2ML IJ SOLN
INTRAMUSCULAR | Status: DC | PRN
Start: 2016-10-15 — End: 2016-10-15
  Administered 2016-10-15: 4 mg via INTRAVENOUS

## 2016-10-15 MED ORDER — OXYCODONE HCL ER 10 MG PO T12A
EXTENDED_RELEASE_TABLET | ORAL | Status: AC
Start: 2016-10-15 — End: ?
  Filled 2016-10-15: qty 1

## 2016-10-15 MED ORDER — EPINEPHRINE HCL 1 MG/ML IJ SOLN (WRAP)
Status: DC | PRN
Start: 2016-10-15 — End: 2016-10-15
  Administered 2016-10-15 (×2): 1 mg via SUBCUTANEOUS

## 2016-10-15 MED ORDER — CLINDAMYCIN PHOSPHATE IN D5W 600 MG/50ML IV SOLN
INTRAVENOUS | Status: AC
Start: 2016-10-15 — End: ?
  Filled 2016-10-15: qty 50

## 2016-10-15 MED ORDER — LABETALOL HCL 5 MG/ML IV SOLN
INTRAVENOUS | Status: DC | PRN
Start: 2016-10-15 — End: 2016-10-15
  Administered 2016-10-15: 5 mg via INTRAVENOUS

## 2016-10-15 MED ORDER — PROPOFOL 10 MG/ML IV EMUL (WRAP)
INTRAVENOUS | Status: AC
Start: 2016-10-15 — End: ?
  Filled 2016-10-15: qty 20

## 2016-10-15 MED ORDER — HYDROMORPHONE HCL 1 MG/ML IJ SOLN
0.5000 mg | INTRAMUSCULAR | Status: DC | PRN
Start: 2016-10-15 — End: 2016-10-20

## 2016-10-15 MED ORDER — ONDANSETRON HCL 4 MG/2ML IJ SOLN
INTRAMUSCULAR | Status: AC
Start: 2016-10-15 — End: ?
  Filled 2016-10-15: qty 2

## 2016-10-15 MED ORDER — ROCURONIUM BROMIDE 50 MG/5ML IV SOLN
INTRAVENOUS | Status: AC
Start: 2016-10-15 — End: ?
  Filled 2016-10-15: qty 5

## 2016-10-15 MED ORDER — PROPOFOL INFUSION 10 MG/ML
INTRAVENOUS | Status: DC | PRN
Start: 2016-10-15 — End: 2016-10-15
  Administered 2016-10-15: 120 mg via INTRAVENOUS

## 2016-10-15 MED ORDER — DIPHENHYDRAMINE HCL 50 MG/ML IJ SOLN
INTRAMUSCULAR | Status: AC
Start: 2016-10-15 — End: ?
  Filled 2016-10-15: qty 1

## 2016-10-15 MED ORDER — SODIUM CHLORIDE 0.9 % IV SOLN
INTRAVENOUS | Status: DC
Start: 2016-10-15 — End: 2016-10-20

## 2016-10-15 MED ORDER — MIDAZOLAM HCL 2 MG/2ML IJ SOLN
INTRAMUSCULAR | Status: AC
Start: 2016-10-15 — End: ?
  Filled 2016-10-15: qty 2

## 2016-10-15 MED ORDER — NALOXONE HCL 4 MG/0.1ML NA LIQD
NASAL | 0 refills | Status: AC
Start: 2016-10-15 — End: ?

## 2016-10-15 MED ORDER — FENTANYL CITRATE (PF) 50 MCG/ML IJ SOLN (WRAP)
25.0000 ug | INTRAMUSCULAR | Status: DC | PRN
Start: 2016-10-15 — End: 2016-10-20
  Administered 2016-10-15: 25 ug via INTRAVENOUS

## 2016-10-15 MED ORDER — OXYCODONE HCL ER 10 MG PO T12A
10.0000 mg | EXTENDED_RELEASE_TABLET | Freq: Two times a day (BID) | ORAL | Status: DC
Start: 2016-10-15 — End: 2016-10-15
  Administered 2016-10-15: 10 mg via ORAL

## 2016-10-15 MED ORDER — METOCLOPRAMIDE HCL 5 MG/ML IJ SOLN
INTRAMUSCULAR | Status: AC
Start: 2016-10-15 — End: 2016-10-15
  Administered 2016-10-15: 10 mg via INTRAVENOUS
  Filled 2016-10-15: qty 2

## 2016-10-15 MED ORDER — FENTANYL CITRATE (PF) 50 MCG/ML IJ SOLN (WRAP)
INTRAMUSCULAR | Status: AC
Start: 2016-10-15 — End: ?
  Filled 2016-10-15: qty 5

## 2016-10-15 MED ORDER — PROMETHAZINE HCL 25 MG/ML IJ SOLN
6.2500 mg | Freq: Once | INTRAMUSCULAR | Status: AC | PRN
Start: 2016-10-15 — End: 2016-10-15

## 2016-10-15 MED ORDER — LIDOCAINE HCL 2 % IJ SOLN
INTRAMUSCULAR | Status: DC | PRN
Start: 2016-10-15 — End: 2016-10-15
  Administered 2016-10-15: 60 mg

## 2016-10-15 MED ORDER — LIDOCAINE HCL 2 % IJ SOLN
INTRAMUSCULAR | Status: DC | PRN
Start: 2016-10-15 — End: 2016-10-15
  Administered 2016-10-15 (×2): 20 mL

## 2016-10-15 MED ORDER — DIPHENHYDRAMINE HCL 50 MG/ML IJ SOLN
12.5000 mg | Freq: Once | INTRAMUSCULAR | Status: AC | PRN
Start: 2016-10-15 — End: 2016-10-15
  Administered 2016-10-15: 12.5 mg via INTRAVENOUS

## 2016-10-15 MED ORDER — ACETAMINOPHEN 500 MG PO TABS
1000.0000 mg | ORAL_TABLET | Freq: Once | ORAL | Status: DC
Start: 2016-10-15 — End: 2016-10-20

## 2016-10-15 MED ORDER — ONDANSETRON 4 MG PO TBDP
4.0000 mg | ORAL_TABLET | Freq: Three times a day (TID) | ORAL | 0 refills | Status: DC | PRN
Start: 2016-10-15 — End: 2017-06-15

## 2016-10-15 MED ORDER — ACETAMINOPHEN 500 MG PO TABS
1000.0000 mg | ORAL_TABLET | Freq: Once | ORAL | Status: DC | PRN
Start: 2016-10-15 — End: 2016-10-20

## 2016-10-15 MED ORDER — FENTANYL CITRATE (PF) 50 MCG/ML IJ SOLN (WRAP)
INTRAMUSCULAR | Status: DC | PRN
Start: 2016-10-15 — End: 2016-10-15
  Administered 2016-10-15: 100 ug via INTRAVENOUS
  Administered 2016-10-15: 50 ug via INTRAVENOUS
  Administered 2016-10-15: 100 ug via INTRAVENOUS

## 2016-10-15 MED ORDER — SODIUM CHLORIDE 0.9 % IR SOLN
Status: DC | PRN
Start: 2016-10-15 — End: 2016-10-15
  Administered 2016-10-15: 1000 mL

## 2016-10-15 MED ORDER — CLINDAMYCIN PHOSPHATE IN D5W 600 MG/50ML IV SOLN
600.0000 mg | Freq: Once | INTRAVENOUS | Status: AC
Start: 2016-10-15 — End: 2016-10-15
  Administered 2016-10-15: 600 mg via INTRAVENOUS

## 2016-10-15 MED ORDER — MIDAZOLAM HCL 2 MG/2ML IJ SOLN
INTRAMUSCULAR | Status: DC | PRN
Start: 2016-10-15 — End: 2016-10-15
  Administered 2016-10-15: 2 mg via INTRAVENOUS

## 2016-10-15 MED ORDER — LACTATED RINGERS IV SOLN
INTRAVENOUS | Status: DC
Start: 2016-10-15 — End: 2016-10-20

## 2016-10-15 MED ORDER — BUPIVACAINE HCL (PF) 0.25 % IJ SOLN
INTRAMUSCULAR | Status: AC
Start: 2016-10-15 — End: ?
  Filled 2016-10-15: qty 30

## 2016-10-15 MED ORDER — SCOPOLAMINE 1 MG/3DAYS TD PT72
MEDICATED_PATCH | TRANSDERMAL | Status: AC
Start: 2016-10-15 — End: ?
  Filled 2016-10-15: qty 1

## 2016-10-15 MED ORDER — DIPHENHYDRAMINE HCL 50 MG/ML IJ SOLN
INTRAMUSCULAR | Status: AC
Start: 2016-10-15 — End: 2016-10-16
  Filled 2016-10-15: qty 1

## 2016-10-15 MED ORDER — SCOPOLAMINE 1 MG/3DAYS TD PT72
1.0000 | MEDICATED_PATCH | TRANSDERMAL | Status: DC
Start: 2016-10-15 — End: 2016-10-15
  Administered 2016-10-15: 1 via TRANSDERMAL

## 2016-10-15 MED ORDER — FENTANYL CITRATE (PF) 50 MCG/ML IJ SOLN (WRAP)
INTRAMUSCULAR | Status: AC
Start: 2016-10-15 — End: 2016-10-15
  Administered 2016-10-15: 25 ug via INTRAVENOUS
  Filled 2016-10-15: qty 2

## 2016-10-15 MED ORDER — LABETALOL HCL 5 MG/ML IV SOLN
INTRAVENOUS | Status: AC
Start: 2016-10-15 — End: ?
  Filled 2016-10-15: qty 20

## 2016-10-15 MED ORDER — CELECOXIB 100 MG PO CAPS
ORAL_CAPSULE | ORAL | Status: AC
Start: 2016-10-15 — End: ?
  Filled 2016-10-15: qty 2

## 2016-10-15 MED ORDER — OXYCODONE HCL 5 MG PO TABS
5.0000 mg | ORAL_TABLET | Freq: Once | ORAL | Status: DC | PRN
Start: 2016-10-15 — End: 2016-10-20

## 2016-10-15 MED ORDER — MEPERIDINE HCL 25 MG/ML IJ SOLN
25.0000 mg | INTRAMUSCULAR | Status: AC
Start: 2016-10-15 — End: 2016-10-15

## 2016-10-15 MED ORDER — CELECOXIB 100 MG PO CAPS
200.0000 mg | ORAL_CAPSULE | Freq: Two times a day (BID) | ORAL | Status: DC
Start: 2016-10-15 — End: 2016-10-15
  Administered 2016-10-15: 200 mg via ORAL

## 2016-10-15 MED ORDER — METOCLOPRAMIDE HCL 5 MG/ML IJ SOLN
10.0000 mg | Freq: Once | INTRAMUSCULAR | Status: AC | PRN
Start: 2016-10-15 — End: 2016-10-15

## 2016-10-15 SURGICAL SUPPLY — 108 items
APPLCATOR CHLORAPREP 26ML (Prep) ×6 IMPLANT
BINDER ABD 12 WIDE (Patient Supply) ×3 IMPLANT
BLADE ELECTRODE INSULATED (Cautery) ×1
BLADE S/SU RIBBACK CARB STL 11 (Blade) ×9 IMPLANT
BLADE S/SU RIBBACK CARB STL 15 (Blade) ×9 IMPLANT
BRA SURGICAL SUPPORT LG (Patient Supply) ×1
CAP COLEMAN LUER LOK 6/PKG (Adapter) ×2
CAP SYRINGE LUER LOCK COLEMAN (Adapter) ×4 IMPLANT
CLOSURE STERI-STRIP 1X5IN (Dressing) ×9 IMPLANT
CLOTH BEACON TIMEOUT ORANGE (Other) ×3 IMPLANT
DRAIN BLAKE RND 19F SILI HBLSS (Drain) ×2
DRAIN OD19 FR RADIOPAQUE 4 FREE FLOW (Drain) ×4
DRAIN OD19 FR RADIOPAQUE 4 FREE FLOW CHANNEL FULL FLUTE CHANNEL DRAIN (Drain) ×4 IMPLANT
DRESSING .5 IN L12 IN X W8 IN TOPIFOAM (Dressing) ×2
DRESSING .5 IN L12 IN X W8 IN TOPIFOAM FOAM SILICONE POLYURETHANE (Dressing) ×2 IMPLANT
DRESSING ABDOMINAL ONE-SZ (Dressing) ×12 IMPLANT
DRESSING BIOPATCH ANTIMIC 3/4" (Dressing) ×1
DRESSING BIOPATCH FOAM CHLORHEXIDINE GLUCONATE NON-ADHESIVE OD3/4 IN (Dressing) ×2 IMPLANT
DRESSING GERMICIDAL 0.75 BIOPATCH (Dressing) ×2
DRESSING PETROLATUM XEROFORM L8 IN X W1 (Dressing) ×6
DRESSING PETROLATUM XEROFORM L8 IN X W1 IN 3% BISMUTH TRIBROMOPHENATE (Dressing) ×6 IMPLANT
DRESSING TEGADERM 8X12IN (Dressing) ×2
DRESSING TRANSPARENT L12 IN X W8 IN (Dressing) ×4
DRESSING TRANSPARENT L12 IN X W8 IN POLYURETHANE ADHESIVE (Dressing) ×4 IMPLANT
DRESSING XEROFORM 1X8 (Dressing) ×3
ELECTRODE ELECTROSURGICAL BLADE L4 IN (Cautery) ×2
ELECTRODE ELECTROSURGICAL BLADE L4 IN OD3/32 IN EDGE L.2 IN INSULATE (Cautery) ×2 IMPLANT
GAUZE FLUFF STERL 6X6 (Dressing) ×6 IMPLANT
GLOVE SURG BIOGEL SENSE SZ 7.5 (Glove) ×6 IMPLANT
GOWN SMART IMPERVIOUS LARGE (Gown) ×9 IMPLANT
HANDLE LIGHT ADAPTIVE LIGHT CONTROL PLUS (Other) ×2
HANDLE LIGHT ADAPTIVE LIGHT CONTROL PLUS TECHNOLOGY SNAP ON LENS TOUCH (Other) ×2 IMPLANT
HANDLE TRUMPF LIGHT  DISP (Other) ×1
KIT INFECTION CONTROL CUSTOM (Kits) ×3
KIT INFECTION CONTROL CUSTOM IFOH03 (Kits) ×2 IMPLANT
MASTISOL VIAL 2/3CC STRL (Skin Closure) ×6 IMPLANT
NEEDLE 18GA 1-1/2IN (Needles) ×3 IMPLANT
NEEDLE SPINAL 20G X 3.5IN (Needles) ×2
NEEDLE SPINAL L3 1/2 IN REGULAR WALL QUINCKE TIP OD20 GA BD (Needles) ×4 IMPLANT
NEEDLE SPNL PP RW BD QNCK 20GA 3.5IN LF (Needles) ×4
NEEDLE YALE SHRTBV 22GX1.5 DSP (Needles) ×6 IMPLANT
PACK MAJOR PLASTIC (Pack) ×3 IMPLANT
PACK SURGICAL UNIVERSAL ABSORBENT (Pack) ×2
PACK SURGICAL UNIVERSAL ABSORBENT REINFORCE ADHESIVE CORD HOLD STRAP (Pack) ×2 IMPLANT
PACK UNIVERSAL PROCED (Pack) ×1
PAD TOPIFOAM ADHSV GEL 8X12IN (Dressing) ×1
RESERVOIR DRAINAGE JACKSON-PRATT BULB SILICONE 100 CC (Drain) ×4 IMPLANT
RESERVOIR DRN SIL 100CC BLB JP LF STRL (Drain) ×4
RESV SUCTION 100CC JACKSON-PRT (Drain) ×2
SOL NACL.9% 1000ML IRR NONLTX (Irrigation Solutions) ×1
SOLUTION IRRIGATION 0.9% SODIUM CHLORIDE (Irrigation Solutions) ×2
SOLUTION IRRIGATION 0.9% SODIUM CHLORIDE 1000 ML PLASTIC POUR BOTTLE (Irrigation Solutions) ×2 IMPLANT
SPONGE LAP XRAY 18X18IN (Sponge) ×2
SPONGE LAPAROTOMY L18 IN X W18 IN (Sponge) ×4
SPONGE LAPAROTOMY L18 IN X W18 IN PREWASH WHITE (Sponge) ×4 IMPLANT
STRIP SKIN CLOSURE L5 IN X W1 IN (Dressing) ×6
STRIP SKIN CLOSURE L5 IN X W1 IN REINFORCE STERI-STRIP POLYESTER WHITE (Dressing) ×6 IMPLANT
SUPPORT MAMMARY 38-40 IN LARGE PAD (Patient Supply) ×2
SUPPORT MAMMARY 38-40 IN LARGE PAD SHOULDER STRAP REMOVABLE DRAIN BULB (Patient Supply) ×2 IMPLANT
SUT ETHIBOND 0 MO6 8X18IN (Suture) ×1
SUT V-LOC 180 3-0 24IN P-12 S (Suture) ×9 IMPLANT
SUTURE ETHIBOND 0 CT1 30IN (Suture) ×1
SUTURE ETHIBOND EXCEL 0 CT-1 L30 IN (Suture) ×2
SUTURE ETHIBOND EXCEL 0 CT-1 L30 IN BRAID NONABSORBABLE (Suture) ×2 IMPLANT
SUTURE ETHIBOND EXCEL GREEN 0 MO-6 L18 (Suture) ×2
SUTURE ETHIBOND EXCEL GREEN 0 MO-6 L18 IN CONTROL RELEASE BRAID 8 (Suture) ×2 IMPLANT
SUTURE ETHILON 3-0 FS1 18IN (Suture) ×1
SUTURE ETHILON 5-0 P3 18IN (Suture) ×3 IMPLANT
SUTURE ETHILON BLACK 3-0 FS-1 L18 IN (Suture) ×2
SUTURE ETHILON BLACK 3-0 FS-1 L18 IN MONOFILAMENT NONABSORBABLE (Suture) ×2 IMPLANT
SUTURE MONOCRYL 3-0 PS-2 18IN (Suture) ×5
SUTURE MONOCRYL 3-0 PS-2 L18 IN (Suture) ×10
SUTURE MONOCRYL 3-0 PS-2 L18 IN MONOFILAMENT UNDYED ABSORBABLE (Suture) ×10 IMPLANT
SUTURE MONOCRYL 3-0 PS2 27IN (Suture) ×3 IMPLANT
SUTURE MONOCRYL 4-0 PS2 27IN (Suture) ×6 IMPLANT
SUTURE PDS II 0 CT-1 L27 IN MONOFILAMENT (Suture) ×6
SUTURE PDS II 0 CT-1 L27 IN MONOFILAMENT VIOLET ABSORBABLE (Suture) ×6 IMPLANT
SUTURE PDS II 0 CT1 27IN (Suture) ×3
SUTURE PDS II 3-0 FS1 27IN (Suture) IMPLANT
SUTURE PLAIN 5-0 S14 (Suture) IMPLANT
SUTURE PLN GUT 5.0 (Suture) ×3 IMPLANT
SUTURE PROLENE 0 MO6 30IN (Suture)
SUTURE PROLENE BLUE 0 MO-6 L30 IN (Suture)
SUTURE PROLENE BLUE 0 MO-6 L30 IN MONOFILAMENT NONABSORBABLE (Suture) IMPLANT
SUTURE VICRYL 3-0 FS2 27IN (Suture) IMPLANT
SUTURE VICRYL 4-0 P3 18IN (Suture) ×6 IMPLANT
SYRINGE 10 ML CONTROL CONCENTRIC TIP (Syringes, Needles) ×8
SYRINGE 10 ML CONTROL CONCENTRIC TIP PYROGEN FREE DEHP FREE LOK (Syringes, Needles) ×8 IMPLANT
SYRINGE 20 ML BD LUER-LOK MEDICAL (Syringes, Needles) ×4 IMPLANT
SYRINGE 50 ML GRADUATE NONPYROGENIC DEHP (Syringes, Needles) ×8
SYRINGE 50 ML GRADUATE NONPYROGENIC DEHP FREE PVC FREE LOK MEDICAL (Syringes, Needles) ×8 IMPLANT
SYRINGE LEUR LOK TIP 30 ML (Syringes, Needles) IMPLANT
SYRINGE LUER LOCK 10CC (Syringes, Needles) ×12 IMPLANT
SYRINGE LUER LOCK SAFETY 10CC (Syringes, Needles) ×6 IMPLANT
SYRINGE LUER LOK 50ML (Syringes, Needles) ×4
SYRINGE LUER-LOK CONTROL 10ML (Syringes, Needles) ×4
SYRINGE LUER-LOK STERILE 20CC (Syringes, Needles) ×2
SYRINGE MED 20ML LL LF STRL (Syringes, Needles) ×4
TOWEL L26 IN X W17 IN COTTON PREWASH DELINT BLUE ACTISORB DELUXE (Procedure Accessories) IMPLANT
TOWEL OR STRL DLUX BLUE 10PK (Procedure Accessories)
TOWEL SRG CTTN 26X17IN LF STRL PREWASH (Procedure Accessories)
TOWEL STERILE REUSABLE 8PK (Procedure Accessories) ×6 IMPLANT
TRAY MAJOR (Pack) ×3 IMPLANT
TUBING ASPIRATION 3/8X12 (Tubing) ×1
TUBING INFILTRATION L13 FT UNIVERSAL Y (Tubing) ×2
TUBING INFILTRATION L13 FT UNIVERSAL Y CONTOUR GENESIS (Tubing) ×2 IMPLANT
TUBING INFILTRTN GENESIS 13'X7 (Tubing) ×1
TUBING SUCTION L12 FT PSI-TEC (Tubing) ×2 IMPLANT

## 2016-10-15 NOTE — Discharge Instr - AVS First Page (Signed)
Reason for your Hospital Admission:  abdominoplasty      Instructions for after your discharge:  Wear binder at all times for a week.  May remove after 72 hours and remove foam and shower.  Pat dry and replace binder.  No heavy lifting or straining  Follow up in one week

## 2016-10-15 NOTE — Progress Notes (Signed)
Patient very irritated. Tore BP cuff off arm, kicked blankets to the floor. States she can't answer the questions I'm asking her. Stated to her husband "I hate her," when referring to this RN. States she just wants to go home.

## 2016-10-15 NOTE — Anesthesia Postprocedure Evaluation (Signed)
Anesthesia Post Evaluation    Patient: Valaree Fresquez [facility]    Procedures performed: Procedure(s) with comments:  ABDOMINOPLASTY, LIPOSUCTION (COSMETIC) - ABDOMINOPLASTY, LIPOSUCTION OF ABDOMEN, FAT GRAFTING TO LABIA  q1=unk, asstn=y, equip=revolve, fat transfer set, md req=3 hrs    Anesthesia type: General ETT    Patient location:Phase I PACU    Last vitals:   Vitals:    10/15/16 1750   BP: 115/55   Pulse: 69   Resp: 16   Temp:    SpO2: 97%       Post pain: Patient not complaining of pain, continue current therapy      Mental Status:awake    Respiratory Function: tolerating room air    Cardiovascular: stable    Nausea/Vomiting: patient not complaining of nausea or vomiting    Hydration Status: adequate    Post assessment: no apparent anesthetic complications    Signed by: Damita Lack, 10/15/2016 6:01 PM

## 2016-10-15 NOTE — Progress Notes (Signed)
Patient c/o feeling itchy. No hives, rash. No difficulty breathing.

## 2016-10-15 NOTE — Discharge Instructions (Signed)
GENERAL DISCHARGE INSTRUCTIONS    You may not drive or do anything requiring coordination or balance for 24 hours.  Rest for the rest of the day.  Avoid heavy lifting for 2 weeks after any surgery.    You may not drink alcohol or consume non-prescribed sedatives or tranquilizers for 24 hours unless approved by your physician.    You should not sign important papers or make important decisions in the next 24 hours.    Please have someone responsible with you the first night you are home.    Keep your dressing/wound site clean and dry.  Do not remove the dressing until advised by your physician.  Wash your hands frequently before and after touching your surgical site.    Begin your diet with clear liquids and progress to your normal diet as long as you are not nauseated. It is suggested that you avoid greasy or spicy foods.    It is suggested that unless specified by the pharmacist, you take all medications with food.  All narcotic type pain medications can cause constipation, keep fluid intake up and increase fiber in your diet.    Things to call your surgeon for:  Persistent Nausea and Vomiting  Chills or a Fever above 101 degrees F  Persistent bleeding, swelling or pus at the operative site  Unable to urinate in 8 hours  Pain that is not relieved by the pain medication.  Loss of feeling or inability to move fingers/toes on the surgical extremity  Blue color of nails or skin on the surgical extremity  Increased coldness of skin on the surgical extremity  Increased swelling of the surgical extremity, especially below the dressing/cast.  Increasing or severe pain, not relieved by your pain medication.

## 2016-10-15 NOTE — Progress Notes (Signed)
Patient c/o nausea and pain. States she doesn't want any more pain medication because she wants to go home.

## 2016-10-15 NOTE — Op Note (Signed)
FULL OPERATIVE NOTE    Date Time: 10/15/16 4:41 PM  Patient Name: Lenis Noon  Attending Physician: Mancel Parsons, MD  MRN: [id number]        Date of Operation:   10/15/2016    Providers Performing:   Surgeon(s):  Mancel Parsons, MD    Assistant (s): Bo Mcclintock time 14:30-16:13  The assistant provided necessary hemostasis, retraction and suturing.    Operative Procedure:   Procedure(s):  ABDOMINOPLASTY, LIPOSUCTION (COSMETIC) progressive tension suture abdominoplasty and lateral liposuction    Preoperative Diagnosis:   Pre-Op Diagnosis Codes:     * Encounter for cosmetic surgery [Z41.1]    Postoperative Diagnosis:   Encounter for cosmetic surgery Z41.1      Estimated Blood Loss:    minimal    Specimens:   Nothing to path, abdominal tissue resected 1597g, liposuction infiltrate 500cc, liposuction aspirate 600cc    Complications:   * No complications entered in OR log *      Indications:   The patient presents for abdominoplasty with lateral abdominal liposuction.  The risks, benefits and alternatives were discussed with the patient.  The patient understands all of the risks and had all of her questions answered to her satisfaction.      Operative Notes:   in the preoperative holding area the patient was marked in the upright position.The vertical midline was marked as well as the lower border of the excision area.  A possible upper border was tentatively marked as well.  The patient was  brought into the operating room and after the induction of general anesthesia,  pneumatic stockings, the abdomen was prepped with chloroprep and draped in a sterile manner.  A surgical pause was performed.  Attention was first directed to the liposuction.  Two stab incisions were made in the lateral abdominal flap and the infiltrating cannula was used to infiltrate standard tumescent solution into the lateral abdomen.  Following this the 4mm Mercedes cannula was used to aspirate fat from the lateral abdomen.  When  this was complete, the low transverse incision was then incised down to abdominal wall fascia.  The abdominal flap was elevated up to the level of the costal margin laterally and the xiphoid in the midline leaving the umbilicus in situ.  Hemostasis was controlled using electrocautery.  A  2-4cm separation of the rectus muscles was noted. The rectus fascia was then plicated in the midline using figure of eight 0  Ethibond sutures.  Following this progressive tensions sutures were placed using running V loc suture.  This was done in the midline from the scarpa's fascia layer of the abdomen to the abdominal wall fascia using the v loc in the midline above the umbilicus.  At the level of the umbilicus, an opening was made in the flap and sutures were placed from the skin of the opening to skin of the umbilicus to the fascia at 12 o clock, 3, 6 and 9 o' clock positions.  These were tied down the the midline progressive tension sutures were continued.  In the area lateral to the umbilicus a sideways narrow V shaped V loc running suture was performed with care to take scarpas fascia only on the flap.  Once this was done the excess was trimmed.  Area did not appear oozy or weepy.  The skin was closed in layers using deep 0 PDS in scarpas fascia and a V loc in the skin. Two   The umbilicus was secured with vicryl and  fast absorbing gut suture. The surgical incision was dressed with mastisol Steri-Strips, umbilicus was packed with xeroform and covered with gauze and tegaderm.  Topifoam was placed over the abdominal flaps and patient was placed in an abdominal binder. The patient was extubated and taken to the recovery room in good condition.  All counts were correct.  Time in OR less than 3 hours.

## 2016-10-15 NOTE — Anesthesia Preprocedure Evaluation (Signed)
Anesthesia Evaluation    AIRWAY    Mallampati: IV    TM distance: >3 FB  Neck ROM: full  Mouth Opening:full   CARDIOVASCULAR    cardiovascular exam normal       DENTAL    no notable dental hx     PULMONARY    pulmonary exam normal     OTHER FINDINGS              Relevant Problems   (+) DM (diabetes mellitus)   (+) HTN (hypertension)   (+) Hypothyroidism               Anesthesia Plan    ASA 3     general               (Risks discussed including but not limited to:    - Neurological complications such as stroke, TIA  - Cardiovascular complications such as heart attack, Arrhythmias, Cardiac arrest   - Pulmonary complications such as Asthmatic attack,Pulm. Aspiration, Bronchospasm and Pneumonia  - Intra-operative awareness,  - Dental Injuries  - Sore Throat.  - Allergic reactions.  - Death.     Anesthesia explained and Questions answered.     Pt understands and wishes to proceed.    Damita Lack, MD)      intravenous induction   Detailed anesthesia plan: general endotracheal        Post op pain management: per surgeon    informed consent obtained    ECG reviewed  pertinent labs reviewed             Signed by: Damita Lack 10/15/16 12:39 PM

## 2016-10-15 NOTE — Transfer of Care (Signed)
Pt awake, breathing spontaneously, in NAD.  VSS. Report given to RN.

## 2016-10-16 ENCOUNTER — Other Ambulatory Visit: Payer: Self-pay

## 2016-11-27 ENCOUNTER — Encounter (INDEPENDENT_AMBULATORY_CARE_PROVIDER_SITE_OTHER): Payer: Self-pay

## 2016-12-03 ENCOUNTER — Other Ambulatory Visit (INDEPENDENT_AMBULATORY_CARE_PROVIDER_SITE_OTHER): Payer: Self-pay | Admitting: Internal Medicine

## 2016-12-23 ENCOUNTER — Other Ambulatory Visit (INDEPENDENT_AMBULATORY_CARE_PROVIDER_SITE_OTHER): Payer: Self-pay | Admitting: Internal Medicine

## 2016-12-29 ENCOUNTER — Other Ambulatory Visit (INDEPENDENT_AMBULATORY_CARE_PROVIDER_SITE_OTHER): Payer: Self-pay | Admitting: Internal Medicine

## 2017-01-14 ENCOUNTER — Other Ambulatory Visit (INDEPENDENT_AMBULATORY_CARE_PROVIDER_SITE_OTHER): Payer: Self-pay | Admitting: Internal Medicine

## 2017-01-14 MED ORDER — IBUPROFEN 800 MG PO TABS
800.0000 mg | ORAL_TABLET | Freq: Four times a day (QID) | ORAL | 1 refills | Status: AC | PRN
Start: 2017-01-14 — End: ?

## 2017-02-01 ENCOUNTER — Other Ambulatory Visit (INDEPENDENT_AMBULATORY_CARE_PROVIDER_SITE_OTHER): Payer: Self-pay | Admitting: Internal Medicine

## 2017-02-02 ENCOUNTER — Other Ambulatory Visit (INDEPENDENT_AMBULATORY_CARE_PROVIDER_SITE_OTHER): Payer: Self-pay

## 2017-02-02 MED ORDER — LEVOTHYROXINE SODIUM 75 MCG PO TABS
75.0000 ug | ORAL_TABLET | Freq: Every day | ORAL | 1 refills | Status: AC
Start: 2017-02-02 — End: ?

## 2017-03-01 ENCOUNTER — Encounter (INDEPENDENT_AMBULATORY_CARE_PROVIDER_SITE_OTHER): Payer: Self-pay

## 2017-03-01 ENCOUNTER — Other Ambulatory Visit (INDEPENDENT_AMBULATORY_CARE_PROVIDER_SITE_OTHER): Payer: Self-pay | Admitting: Internal Medicine

## 2017-03-01 NOTE — Progress Notes (Signed)
Rx faxed

## 2017-03-26 ENCOUNTER — Other Ambulatory Visit (INDEPENDENT_AMBULATORY_CARE_PROVIDER_SITE_OTHER): Payer: Self-pay | Admitting: Internal Medicine

## 2017-03-26 DIAGNOSIS — B009 Herpesviral infection, unspecified: Secondary | ICD-10-CM

## 2017-03-27 ENCOUNTER — Other Ambulatory Visit (INDEPENDENT_AMBULATORY_CARE_PROVIDER_SITE_OTHER): Payer: Self-pay | Admitting: Internal Medicine

## 2017-06-06 ENCOUNTER — Encounter (RURAL_HEALTH_CENTER): Payer: Self-pay | Admitting: Surgery

## 2017-06-06 ENCOUNTER — Ambulatory Visit: Attending: Surgery | Admitting: Surgery

## 2017-06-06 VITALS — BP 128/72 | HR 56 | Temp 97.9°F | Ht 62.75 in | Wt 159.0 lb

## 2017-06-06 DIAGNOSIS — D1724 Benign lipomatous neoplasm of skin and subcutaneous tissue of left leg: Secondary | ICD-10-CM

## 2017-06-06 DIAGNOSIS — D179 Benign lipomatous neoplasm, unspecified: Secondary | ICD-10-CM

## 2017-06-06 NOTE — Addendum Note (Signed)
Addended by: Randalyn Rhea on: 06/06/2017 04:17 PM     Modules accepted: Orders

## 2017-06-06 NOTE — Progress Notes (Signed)
Pt presents with a tender nodule on her left lateral thigh which has been present for the months; she associates it with occasional episodes of pain extending down the lateral side of her leg. She also complains of a mass on her left posterior shoulder/upper back. 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    Examination reveals the posterior left shoulder mass to be soft, approximately 2.5 -3 cm in diameter overlying the trapezius muscle. In the left thigh she had a more well-defined 1 cm firm nodule unfixed to the skin is tender to palpation.    After discussing risks and benefits and obtaining informed consent a timeout was performed. The left thigh skin was prepped with chlorhexidine and Betadine, and after infiltration with 2.0 ml of 1% lidocaine with epinephrine a 1.5 cm incision was made over the mass and a well encapsulated 1 cm lipoma was delivered through the wound and sent for pathologic examination.     The incision was closed with 3-0 Nylon sutures and 4.  A sterile dressing was applied.      The patient will return for suture removal in 7 to 10 days. We will consider scheduling her for excision of the left posterior shoulder/back lipoma at that time.    Instructions:  May remove bandage and shower tomorrow; cover with bandage between showers.  No baths, no swimming until sutures are removed.

## 2017-06-06 NOTE — Patient Instructions (Signed)
May remove bandage and shower tomorrow; cover with bandage between showers.  No baths, no swimming until sutures are removed.

## 2017-06-07 ENCOUNTER — Other Ambulatory Visit
Admission: RE | Admit: 2017-06-07 | Discharge: 2017-06-07 | Disposition: A | Discharge status: Home / Self Care | Source: Ambulatory Visit | Attending: Surgery | Admitting: Surgery

## 2017-06-07 DIAGNOSIS — D179 Benign lipomatous neoplasm, unspecified: Secondary | ICD-10-CM

## 2017-06-15 ENCOUNTER — Encounter (RURAL_HEALTH_CENTER): Payer: Self-pay

## 2017-06-15 ENCOUNTER — Encounter (RURAL_HEALTH_CENTER): Payer: Self-pay | Admitting: Surgery

## 2017-06-15 ENCOUNTER — Other Ambulatory Visit (RURAL_HEALTH_CENTER): Payer: Self-pay

## 2017-06-15 ENCOUNTER — Ambulatory Visit: Attending: Surgery | Admitting: Surgery

## 2017-06-15 VITALS — BP 124/64 | HR 62 | Temp 98.0°F | Wt 159.0 lb

## 2017-06-15 DIAGNOSIS — D179 Benign lipomatous neoplasm, unspecified: Secondary | ICD-10-CM

## 2017-06-15 NOTE — Patient Instructions (Signed)
Avoid direct sun exposure to incision for the next 6 weeks.

## 2017-06-15 NOTE — Progress Notes (Signed)
The patient presents today 9 days status post excision of a left lateral thigh lipoma    The incision is well healed without sign of infection.  Sutures were removed today; benzoin and Steri-Strips applied     Pathology report revealed:  SOFT TISSUE, THIGH, EXCISION:  -BENIGN FAT CONSISTENT WITH LIPOMA.     The patient noted another small lipoma in the left lateral thigh which is tender, anterior to the incision from her last lipoma by approximately 2-3 cm.  She also has an approximately 3 cm in diameter mass overlying the lateral trapezius muscle consistent with lipoma.    Patient Instructions   Avoid direct sun exposure to incision for the next 6 weeks.      Return for Excision of lipomas in the OR when scheduled.

## 2017-06-15 NOTE — Progress Notes (Signed)
Minor Surgery Instructions    Your surgery has been tentatively scheduled for July 12, 2017 .     [facility] will be obtaining a brief medical assessment called a preoperative interview.  You will meet with one of their nurses, and possibly with someone from the anesthesia department.  If blood work, EKG, and/or X-ray has been ordered, it will be done during your interview time.  Please plan to be there for at least 1 hour.     If a preoperative interview and/or testing is required, you will be receiving a call from [facility] OR scheduler to arrange a date and time: ______________________________________   If you receive message to schedule your interview or testing, please call back as soon as possible to assure that your procedure will not be canceled. When your interview and/or testing is scheduled you will need to bring the following items for you pre-operative interview; prescribed medication bottles, vitamins and supplements.     If you have not been scheduled for a preoperative interview, your interview will take place the same morning as your procedure.      Please call [phone] the day prior to your procedure between 1PM and 4PM to find out what time to arrive for preparation for your procedure. If your procedure is on Monday, please call Friday afternoon.    If you have not been scheduled for a preoperative interview, your interview will take place the same morning as your procedure.    We do try to keep to the schedule you have been given; however, unexpected events can change the schedule.  We apologize in advance for any inconvenience that this may cause.  Please follow the instructions below to prepare you for this procedure.  If you should have any questions, please call [facility] at [phone].    If you take a blood thinner please follow the following instructions:  On no blood thinners  DO NOT TAKE ANY ADVIL/MOTRIN 24 HOURS PRIOR  PROCEDURE                                The morning prior to surgery:    A:  You should shower or bathe antibacterial soap.    B:  Please DO NOT eat or drink anything after midnight before your procedure.PLEASE SEE ATTACHED YELLOW SHEET FOR LIQUIDS THAT YOU MAY HAVE UP TO 2 HOURS PRIOR YOUR ARRIVAL TIME.    C:DO NOT TAKE ANY MEDICATIONS THE MORNING OF PROCEDURE. If you have any questions regarding your medication, please call us.  (If you are diabetic we will give you instructions on what you should do about taking your medications).    D:  You must have someone to drive you home after your procedure.  We cannot allow you to drive once you have been medicated.                                                                        Minor Surgery Instructions    After Procedure      1. For the next 24 hours it is best to:   a. Please keep the area dry for 24 hours.  b. May return to work, for light duty. In most cases you can return to full work the next day.     2. You may experience the following:   a. Mild leakage or bleeding from the incision sites.   b. Mild pain may be controlled with an over the counter pain reliever such as Ibuprofen or Tylenol. The dosage range for Ibuprofen is 200mg  to 800mg  (3) three times a day. Tylenol dosage range is 325mg  to 1000mg  (4) four times a day. Ibuprofen may cause stomach irritation while Tylenol will not. If your pain is not controlled with the above please call the clinic at [phone].     3. Please call [facility] at [phone] if you observe any of the following; if unable to reach the office please call the hospital at [phone] and ask for the [facility] physician to be paged.   a. Excessive bleeding from incision sites; if you have to change the dressing more than twice. Redness, swelling or draining from the incision.   b. Fever or other unusual symptoms.   IF YOU THINK THAT YOU HAVE AN EMERGENCY, GO DIRECTLY TO YOUR NEAREST EMERGENCY  ROOM.    4. Dressing Care:   a. You will have a dressing over the wound that should stay on for a day.   b. Remove the dressing after 24 hours and treat the wound as you would normal skin, except to avoid rubbing it.   c. You may have skin glue on wound. Avoid placing ointment on skin glue.   d. If the dressing gets wet just remove it and replace it with a clean one.     5.  If you did not receive a follow up appointment when leaving the hospital, please call the office at [phone] to make an appointment.

## 2017-07-04 ENCOUNTER — Other Ambulatory Visit (INDEPENDENT_AMBULATORY_CARE_PROVIDER_SITE_OTHER): Payer: Self-pay

## 2017-07-04 DIAGNOSIS — B009 Herpesviral infection, unspecified: Secondary | ICD-10-CM

## 2017-07-04 MED ORDER — VALACYCLOVIR HCL 500 MG PO TABS
500.0000 mg | ORAL_TABLET | Freq: Every day | ORAL | 1 refills | Status: AC | PRN
Start: 2017-07-04 — End: ?

## 2017-07-07 ENCOUNTER — Ambulatory Visit: Attending: Surgery

## 2017-07-07 DIAGNOSIS — Z01818 Encounter for other preprocedural examination: Secondary | ICD-10-CM | POA: Insufficient documentation

## 2017-07-09 LAB — ECG 12-LEAD
P Wave Axis: 55 deg
P-R Interval: 174 ms
Patient Age: 61 years
Q-T Interval(Corrected): 434 ms
Q-T Interval: 408 ms
QRS Axis: 11 deg
QRS Duration: 90 ms
T Axis: 25 years
Ventricular Rate: 68 //min

## 2017-07-12 ENCOUNTER — Ambulatory Visit: Admitting: Certified Registered"

## 2017-07-12 ENCOUNTER — Ambulatory Visit
Admission: RE | Admit: 2017-07-12 | Discharge: 2017-07-12 | Disposition: A | Discharge status: Home / Self Care | Source: Ambulatory Visit | Attending: Surgery | Admitting: Surgery

## 2017-07-12 ENCOUNTER — Encounter
Admission: RE | Disposition: A | Discharge status: Home / Self Care | Payer: Self-pay | Source: Ambulatory Visit | Attending: Surgery

## 2017-07-12 DIAGNOSIS — M329 Systemic lupus erythematosus, unspecified: Secondary | ICD-10-CM | POA: Insufficient documentation

## 2017-07-12 DIAGNOSIS — Z809 Family history of malignant neoplasm, unspecified: Secondary | ICD-10-CM | POA: Insufficient documentation

## 2017-07-12 DIAGNOSIS — E109 Type 1 diabetes mellitus without complications: Secondary | ICD-10-CM | POA: Insufficient documentation

## 2017-07-12 DIAGNOSIS — Z794 Long term (current) use of insulin: Secondary | ICD-10-CM | POA: Insufficient documentation

## 2017-07-12 DIAGNOSIS — K219 Gastro-esophageal reflux disease without esophagitis: Secondary | ICD-10-CM | POA: Insufficient documentation

## 2017-07-12 DIAGNOSIS — Z9842 Cataract extraction status, left eye: Secondary | ICD-10-CM | POA: Insufficient documentation

## 2017-07-12 DIAGNOSIS — D179 Benign lipomatous neoplasm, unspecified: Secondary | ICD-10-CM | POA: Diagnosis present

## 2017-07-12 DIAGNOSIS — D1724 Benign lipomatous neoplasm of skin and subcutaneous tissue of left leg: Secondary | ICD-10-CM | POA: Insufficient documentation

## 2017-07-12 DIAGNOSIS — I10 Essential (primary) hypertension: Secondary | ICD-10-CM | POA: Insufficient documentation

## 2017-07-12 DIAGNOSIS — K589 Irritable bowel syndrome without diarrhea: Secondary | ICD-10-CM | POA: Insufficient documentation

## 2017-07-12 DIAGNOSIS — J302 Other seasonal allergic rhinitis: Secondary | ICD-10-CM | POA: Insufficient documentation

## 2017-07-12 DIAGNOSIS — Z8249 Family history of ischemic heart disease and other diseases of the circulatory system: Secondary | ICD-10-CM | POA: Insufficient documentation

## 2017-07-12 DIAGNOSIS — D170 Benign lipomatous neoplasm of skin and subcutaneous tissue of head, face and neck: Secondary | ICD-10-CM | POA: Insufficient documentation

## 2017-07-12 DIAGNOSIS — Z8261 Family history of arthritis: Secondary | ICD-10-CM | POA: Insufficient documentation

## 2017-07-12 DIAGNOSIS — Z9841 Cataract extraction status, right eye: Secondary | ICD-10-CM | POA: Insufficient documentation

## 2017-07-12 DIAGNOSIS — E785 Hyperlipidemia, unspecified: Secondary | ICD-10-CM | POA: Insufficient documentation

## 2017-07-12 DIAGNOSIS — Z885 Allergy status to narcotic agent status: Secondary | ICD-10-CM | POA: Insufficient documentation

## 2017-07-12 DIAGNOSIS — Z8349 Family history of other endocrine, nutritional and metabolic diseases: Secondary | ICD-10-CM | POA: Insufficient documentation

## 2017-07-12 DIAGNOSIS — Z880 Allergy status to penicillin: Secondary | ICD-10-CM | POA: Insufficient documentation

## 2017-07-12 DIAGNOSIS — E039 Hypothyroidism, unspecified: Secondary | ICD-10-CM | POA: Insufficient documentation

## 2017-07-12 DIAGNOSIS — Z87891 Personal history of nicotine dependence: Secondary | ICD-10-CM | POA: Insufficient documentation

## 2017-07-12 LAB — VH DEXTROSE STICK GLUCOSE
Glucose POCT: 90 mg/dL (ref 71–99)
Glucose POCT: 73 mg/dL (ref 71–99)

## 2017-07-12 SURGERY — EXCISION, LIPOMA
Anesthesia: Anesthesia MAC / Sedation | Wound class: Clean

## 2017-07-12 MED ORDER — LIDOCAINE HCL 2 % IJ SOLN
INTRAMUSCULAR | Status: AC
Start: 2017-07-12 — End: ?
  Filled 2017-07-12: qty 20

## 2017-07-12 MED ORDER — ISOFLURANE IN SOLN
RESPIRATORY_TRACT | Status: AC
Start: 2017-07-12 — End: ?
  Filled 2017-07-12: qty 100

## 2017-07-12 MED ORDER — PROPOFOL INFUSION 10 MG/ML
INTRAVENOUS | Status: AC
Start: 2017-07-12 — End: ?
  Filled 2017-07-12: qty 10

## 2017-07-12 MED ORDER — LACTATED RINGERS IV SOLN
INTRAVENOUS | Status: DC
Start: 2017-07-12 — End: 2017-07-12

## 2017-07-12 MED ORDER — ACETAMINOPHEN 10 MG/ML IV SOLN
INTRAVENOUS | Status: DC | PRN
Start: 2017-07-12 — End: 2017-07-12
  Administered 2017-07-12: 1000 mg via INTRAVENOUS

## 2017-07-12 MED ORDER — FENTANYL CITRATE (PF) 50 MCG/ML IJ SOLN (WRAP)
INTRAMUSCULAR | Status: AC
Start: 2017-07-12 — End: ?
  Filled 2017-07-12: qty 2

## 2017-07-12 MED ORDER — MIDAZOLAM HCL 2 MG/2ML IJ SOLN
INTRAMUSCULAR | Status: AC
Start: 2017-07-12 — End: ?
  Filled 2017-07-12: qty 2

## 2017-07-12 MED ORDER — KETOROLAC TROMETHAMINE 30 MG/ML IJ SOLN
INTRAMUSCULAR | Status: AC
Start: 2017-07-12 — End: ?
  Filled 2017-07-12: qty 1

## 2017-07-12 MED ORDER — BUPIVACAINE-EPINEPHRINE (PF) 0.25% -1:200000 IJ SOLN
INTRAMUSCULAR | Status: AC
Start: 2017-07-12 — End: ?
  Filled 2017-07-12: qty 30

## 2017-07-12 MED ORDER — BUPIVACAINE-EPINEPHRINE (PF) 0.25% -1:200000 IJ SOLN
INTRAMUSCULAR | Status: DC | PRN
Start: 2017-07-12 — End: 2017-07-12
  Administered 2017-07-12: 18 mL via INTRAMUSCULAR

## 2017-07-12 MED ORDER — ACETAMINOPHEN 10 MG/ML IV SOLN
INTRAVENOUS | Status: AC
Start: 2017-07-12 — End: ?
  Filled 2017-07-12: qty 100

## 2017-07-12 MED ORDER — MIDAZOLAM HCL 2 MG/2ML IJ SOLN
INTRAMUSCULAR | Status: DC | PRN
Start: 2017-07-12 — End: 2017-07-12
  Administered 2017-07-12: 2 mg via INTRAVENOUS

## 2017-07-12 MED ORDER — VH PHENYLEPHRINE 120 MCG/ML IV BOLUS (ANESTHESIA)
PREFILLED_SYRINGE | INTRAVENOUS | Status: DC | PRN
Start: 2017-07-12 — End: 2017-07-12
  Administered 2017-07-12: 240 ug via INTRAVENOUS
  Administered 2017-07-12: 120 ug via INTRAVENOUS

## 2017-07-12 MED ORDER — LIDOCAINE HCL (PF) 2 % IJ SOLN
INTRAMUSCULAR | Status: DC | PRN
Start: 2017-07-12 — End: 2017-07-12
  Administered 2017-07-12: 50 mg via INTRAVENOUS

## 2017-07-12 MED ORDER — ONDANSETRON HCL 4 MG/2ML IJ SOLN
INTRAMUSCULAR | Status: AC
Start: 2017-07-12 — End: ?
  Filled 2017-07-12: qty 2

## 2017-07-12 MED ORDER — DEXAMETHASONE SODIUM PHOSPHATE 4 MG/ML IJ SOLN
INTRAMUSCULAR | Status: AC
Start: 2017-07-12 — End: ?
  Filled 2017-07-12: qty 1

## 2017-07-12 MED ORDER — PROPOFOL 200 MG/20ML IV EMUL
INTRAVENOUS | Status: DC | PRN
Start: 2017-07-12 — End: 2017-07-12
  Administered 2017-07-12: 20 mg via INTRAVENOUS
  Administered 2017-07-12: 10 mg via INTRAVENOUS
  Administered 2017-07-12: 20 mg via INTRAVENOUS
  Administered 2017-07-12 (×3): 10 mg via INTRAVENOUS
  Administered 2017-07-12: 50 mg via INTRAVENOUS
  Administered 2017-07-12 (×5): 10 mg via INTRAVENOUS
  Administered 2017-07-12: 20 mg via INTRAVENOUS
  Administered 2017-07-12 (×2): 10 mg via INTRAVENOUS

## 2017-07-12 MED ORDER — FENTANYL CITRATE (PF) 50 MCG/ML IJ SOLN (WRAP)
INTRAMUSCULAR | Status: DC | PRN
Start: 2017-07-12 — End: 2017-07-12
  Administered 2017-07-12: 25 ug via INTRAVENOUS
  Administered 2017-07-12: 50 ug via INTRAVENOUS
  Administered 2017-07-12: 25 ug via INTRAVENOUS

## 2017-07-12 SURGICAL SUPPLY — 14 items
CHLORAPREP W/ORANGE TINT 26ML (Supply) ×2 IMPLANT
DRSG CONVAFOAM SIL 4X4 BORDER (Dressings) IMPLANT
DRSG OPTIFOAM 4X4 BORDER (Dressings) ×2
GLOVE BIOGEL UT PI SURG SZ 7.5 (Supply) ×2 IMPLANT
NDL HYPO 25GA X 1.5 (Supply) ×4 IMPLANT
PACK SMH GENERAL SURGERY (Supply) ×2 IMPLANT
STERISTRIP .25 (Supply) ×1 IMPLANT
STERISTRIP 1/2 IN X 4 IN (Supply) ×1 IMPLANT
SUT PROLENE 4-0 8682G (Supply) ×1 IMPLANT
SUT VICRYL 3-0 J316H (Supply) ×1 IMPLANT
SUT VICRYL 4-0 J214H (Supply) ×1 IMPLANT
SUT VICRYL 5-0 J495H (Supply) ×2 IMPLANT
SYRINGE 10CC LL (Supply) ×4 IMPLANT
TRAY SKIN PREP  DRY (Supply) ×2 IMPLANT

## 2017-07-12 NOTE — Transfer of Care (Signed)
Anesthesia Transfer of Care Note    Patient: Kellie Fuentes    Last vitals:   Vitals:    07/12/17 1047   BP: (!) 92/31   Pulse: 71   Resp: 15   Temp: 36.7 C (98.1 F)   SpO2: 97%       Oxygen: Room Air     Mental Status:awake    Airway: Natural    Cardiovascular Status:  stable Verbal report given to RN at bedside.

## 2017-07-12 NOTE — Op Note (Signed)
OP NOTE        [facility] MAIN OR      Date Time: 07/12/17 10:41 AM    Patient Name:   Kellie Fuentes    Date of Operation:   07/12/2017    Providers Performing:   Surgeon(s):  Randalyn Rhea, MD    Assistant (s):   Circulator: Day, Barkley Bruns, RN  Scrub Person: Milon Dikes, RN    Preoperative Diagnosis:   Pre-Op Diagnosis Codes:     * Multiple lipomas [D17.9]    Postoperative Diagnosis:   Post-Op Diagnosis Codes:     * Multiple lipomas [D17.9]    Operative Procedure:   Procedure(s):  EXCISION, LIPOMAS    Anesthesia:   IV sedation and 18 ml of 0.25 % Marcaine with epi    Estimated Blood Loss:   2 mL    Procedure:   The patient in the right lateral decubitus position the left lateral antero-thigh was prepped with ChloraPrep and the left posterior neck was prepped with ChloraPrep also. After infiltration with 0.25% Marcaine with epinephrine a 2.5 cm incision was made over the thigh mass and a well encapsulated 2 cm diameter lipoma was delivered through the wound and sent for pathologic examination. The skin was closed with a running subcuticular 5-0 Vicryl suture. The neck was then addressed and a transverse incision was made in the direction of the skin folds for approximately 4 cm overlying the mass anterior to the trapezius.  A lobulated somewhat ill-defined fatty mass was delivered through the wound and sent for pathologic examination and the skin was closed with a running 5-0 Vicryl subcuticular suture. Sutures were applied and the patient was taken back to the [facility] in satisfactory condition    Specimens:     ID Type Source Tests Collected by Time Destination   A : left thigh Excision Lipoma [facility] SURGICAL PATHOLOGY Randalyn Rhea, MD 07/12/2017 1027    B : posterior left neck Excision Lipoma [facility] SURGICAL PATHOLOGY Randalyn Rhea, MD 07/12/2017 1028        Findings:   Well encapsulated 2 cm left thigh lipoma; lobulated, ill defined fatty mass in left neck    Complications:   none    Signed by:  Randalyn Rhea, MD

## 2017-07-12 NOTE — Anesthesia Postprocedure Evaluation (Signed)
Anesthesia Post Evaluation    Patient: Kellie Fuentes [facility]    Procedures performed: Procedure(s) with comments:  EXCISION, LIPOMA - excision lipomas x 2, left upper back/base of neck; left thigh    Anesthesia type: MAC    Patient location:Phase II PACU    Last vitals:   Vitals:    07/12/17 1047   BP: (!) 92/31   Pulse: 71   Resp: 15   Temp: 36.7 C (98.1 F)   SpO2: 97%       Post pain: Continue adjustment of pain medication; Marland Kitchen     Mental Status:awake    Respiratory Function: tolerating room air    Cardiovascular: stable    Nausea/Vomiting: patient not complaining of nausea or vomiting    Hydration Status: adequate    Post assessment: no apparent anesthetic complications, no reportable events and no evidence of recall

## 2017-07-12 NOTE — Discharge Instr - AVS First Page (Addendum)
Keep bandages on and dry until day after tomorrrow.  Then may remove and shower, pat dry, and recover with bandages for the next 3-4 days.  Then no bandages needed.  Call me for any sign of infection ( drainage other than a few drops of blood; or heat, redness around incision).     May use ice packs over the bandages as needed for additional pain control    [facility]  Discharge Instructions    A.  To have a safe recovery from your operation, for the next 24 hours.   1.  Do not drink alcoholic beverages.   2.  Do not make important personal or business decisions.   3.  Do not drive a vehicle or operate hazardous machinery.   4.  Do not become overheated or too cold.    B.  Medications:  See attached medication form.    C.  Diet:   1.  Initially start with light foods and then progress to your normal diet.   2.  Drink plenty of fluids such as water, tea and fruit juices.    D.  Educational Handouts:   1.  Physician's Instruction sheet.   2.  ***    E.  Contact your surgeon if you have any questions or if any of the following occur:   1.  Your bandages become soaked with bright red blood (do not remove the original bandages).   2.  Excessive swelling around the wound/incision.   3.  Chills and fever above 101F.   4.  Excessive pain.   5.  Excessive drainage.   6.  Continued nausea or vomiting.      F.  If you had spinal anesthesia and are unable to urinate within 8 hours after discharge or at any time experience lower abdominal pain and are unable to urinate go to the Emergency Room.    G.  Additional Remarks  Call Monday the 24th for the results     H.  Follow up appointment with Dr.     Prince Solian instructions have been reviewed with the patient and responsible adult.    Telephone numbers:   [facility] Switchboard   [phone]   [facility]  [phone]   Emergency Department  [phone]

## 2017-07-12 NOTE — Anesthesia Preprocedure Evaluation (Addendum)
Anesthesia Evaluation    AIRWAY    Mallampati: III    TM distance: <3 FB  Neck ROM: full  Mouth Opening:full   CARDIOVASCULAR    cardiovascular exam normal and regular       DENTAL    no notable dental hx     PULMONARY    pulmonary exam normal and clear to auscultation     OTHER FINDINGS    Informed consent obtained and the discussed possibility of anesthesia complications including but not limited to: aspiration, medication reactions; need for intervention including use of pressors, line placement or invasive procedures.  Discussed possibility of dental damage, laryngospasm, cardiopulmonary issues, positional injuries and anesthesia options.  If MAC sedation is selected then increased possibility of recall was discussed as well as need for possible jaw thrust maneuver and residual sore jaw.   Pt voiced understanding and wished to proceed with procedure.       Discussed that some medications may impair effectiveness of OCPs or other hormonal birth control so should avoid sexual activity or use barrier method if this applies.          Relevant Problems   (+) DM (diabetes mellitus)   (+) HTN (hypertension)   (+) Hypothyroidism               Anesthesia Plan    ASA 3     general                     intravenous induction   Detailed anesthesia plan: MAC        Post op pain management: per surgeon        ECG reviewed  pertinent labs reviewed             Signed by: Marcy Siren 07/12/17 8:27 AM

## 2017-07-12 NOTE — H&P (Signed)
HISTORY AND PHYSICAL    Date Time: 07/12/2017 9:34 AM  Patient Name: Kellie Fuentes  Attending Physician: Randalyn Rhea, MD  Primary Care Physician: Vianne Bulls, MD      History of Presenting Illness:   Kellie Fuentes is a 61 y.o. female who presents For excision of lipomas of the posterior neck and left lateral thigh. She is status post excision of multiple lipomas over the years in the past. The current 2 lipomas have been present for approximately 5 years.    Past Medical History:     Past Medical History:   Diagnosis Date   . 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 Gastroesophageal reflux disease    . Hyperlipidemia    . Hypertension     stable on meds   . Hypothyroidism    . Irritable bowel syndrome    . Seasonal allergies    . Systemic lupus erythematosus    . Type 1 diabetes mellitus        Past Surgical History:     Past Surgical History:   Procedure Laterality Date   . ABDOMINOPLASTY, LIPOSUCTION (COSMETIC) N/A 10/15/2016    Procedure: ABDOMINOPLASTY, LIPOSUCTION (COSMETIC);  Surgeon: Mancel Parsons, MD;  Location: [facility];  Service: Plastics;  Laterality: N/A;  ABDOMINOPLASTY, LIPOSUCTION OF ABDOMEN, FAT GRAFTING TO LABIA  q1=unk, asstn=y, equip=revolve, fat transfer set, md req=3 hrs   . BREAST SURGERY Bilateral     reduction   . CYST REMOVAL      benign  to chest, right shoulder blade,top of right arm   . EGD, COLONOSCOPY     . EYE SURGERY Bilateral     cataract   . KNEE SURGERY     . LAPAROSCOPY, DIAGNOSTIC         Family History:     Family History   Problem Relation Age of Onset   . Cancer Mother    . Arthritis Mother    . Thyroid disease Mother    . Cancer Father    . Heart disease Father        Social History:     History   Smoking Status   . Former Smoker   Smokeless Tobacco   . Never Used     History   Alcohol Use   . 0.6 oz/week   . 1 Glasses of wine per week     Comment: every night     History   Drug Use No        Allergies:     Allergies   Allergen Reactions   . Tramadol Itching     Itching on body   . Penicillins Rash     Constricts airway   . Ampicillin Rash     Constricts airway       Medications:     Prior to Admission medications    Medication Sig Start Date End Date Taking? Authorizing Provider   ALPRAZolam Prudy Feeler) 0.5 MG tablet TAKE ONE TABLET BY MOUTH AT NIGHT AS NEEDED FOR SLEEP 03/01/17  Yes Lenord Fellers, MD   Armodafinil 150 MG tablet Take 150 mg by mouth as needed.       Yes [provider]   b complex vitamins tablet Take 1 tablet by mouth daily.   Yes [provider]   cyproheptadine (PERIACTIN) 4 MG tablet Take 4 mg by mouth 3 (three) times daily  as needed.   Yes [provider]   fluticasone (CUTIVATE) 0.05 % cream Apply topically daily.    11/15/13  Yes [provider]   glimepiride (AMARYL) 2 MG tablet Take 2 mg by mouth 2 (two) times daily.   Yes [provider]   hydroxychloroquine (PLAQUENIL) 200 MG tablet Take 200 mg by mouth 2 (two) times daily.     08/23/13  Yes [provider]   ibuprofen (ADVIL,MOTRIN) 800 MG tablet Take 1 tablet (800 mg total) by mouth every 6 (six) hours as needed for Pain. 01/14/17  Yes Lenord Fellers, MD   insulin glargine (BASAGLAR KWIKPEN) 100 UNIT/ML injection pen Inject 60 Units into the skin daily.  Patient taking differently: Inject 40 Units into the skin nightly.     06/29/16  Yes Lenord Fellers, MD   irbesartan (AVAPRO) 75 MG tablet TAKE ONE TABLET BY MOUTH DAILY 03/28/17  Yes Lenord Fellers, MD   levothyroxine (SYNTHROID, LEVOTHROID) 75 MCG tablet Take 1 tablet (75 mcg total) by mouth Once a day at 6:00am. 02/02/17  Yes Lenord Fellers, MD   Magnesium 500 MG Cap Take 250 mg by mouth every morning before breakfast.       Yes [provider]   Magnesium Cl-Calcium Carbonate (SLOW-MAG PO) Take by mouth nightly.       Yes [provider]   methscopolamine (PAMINE) 2.5 MG Tab Take 2.5 mg by mouth as needed.        Yes [provider]   naloxone (NARCAN) 4 MG/0.1ML Liquid nasal spray 1 spray intranasally. If pt does not respond or relapses into respiratory depression call 911. Give additional doses every 2-3 min. 10/15/16  Yes Mancel Parsons, MD   traMADol (ULTRAM) 50 MG tablet Take 50 mg by mouth every 8 (eight) hours as needed for Pain.   Yes [provider]   valACYclovir HCL (VALTREX) 500 MG tablet Take 1 tablet (500 mg total) by mouth daily as needed (HSV). 07/04/17  Yes Lenord Fellers, MD   VITAMIN D, CHOLECALCIFEROL, PO Take by mouth daily.       Yes [provider]         Physical Exam:     Vitals:    07/12/17 0810   BP: 121/54   Pulse: (!) 56   Resp: 16   Temp: 98.4 F (36.9 C)   SpO2: 99%       General: awake, alert, oriented x 3; no acute distress.  HEENT: eomi, sclera anicteric, mucous membranes moist  Neck: supple, no lymphadenopathy; in the left posterior base of the neck overlying the trapezius muscle is a 3 cm diameter soft mass consistent with lipoma  Cardiovascular: regular rate and rhythm, no murmurs, rubs or gallops  Lungs: clear to auscultation bilaterally, without wheezing, rhonchi, or rales  Abdomen: *flat, soft, normoactive bowel sounds, non-tender without any rebound or guarding,  no palpable masses or organomegally  Extremities: no clubbing, cyanosis, or edema; in the left lateral mid thigh just below the previous incision is an approximately 2 cm diameter soft mass consistent with lipoma  Skin: no rashes or lesions noted    Diagnostic studies:   N/A    Assessment:   Left upper posterior neck/shoulder lipoma  Left lateral thigh lipoma    Plan:   Excision of lipomas 2    Signed by: Randalyn Rhea, MD

## 2017-07-13 ENCOUNTER — Encounter: Payer: Self-pay | Admitting: Surgery

## 2017-07-18 NOTE — Retrospective Coding Query (Signed)
PHYSICIAN'S DOCUMENTATION                                                                      REQUEST                                                                         Date of Request:  07/18/2017  Type of Request:  DOCUMENTATION CLARIFICATION                                         Patient Name: Kellie Fuentes, Kellie Fuentes  Account #: [id number]  MR #: [id number]  Discharge Date: 07/12/2017       Dear Dr. Marlene Bast    Lipomas removed form thigh and neck  Path report   A- LEFT THIGH LIPOMA; B- POSTERIOR LEFT NECK LIPOMA  Gross Description:  A- The specimen is labeled as left thigh and consists of an ovoid adipose tissue with thin capsule measuring 2.5 cm in greatest dimension. Ess 1.     B- The specimen is labeled posterior left neck and consists of two pieces of lobulated fatty tissue measuring 4 x 3.5 x 1.8 cm in aggregate. Rss 1.     07/13/17  JX/ac  Diagnosis:  A- SOFT TISSUE, LEFT THIGH, EXCISION:  -MATURE ADIPOSE TISSUE CONSISTENT WITH LIPOMA.    B- SOFT TISSUE, LEFT POSTERIOR NECK, EXCISION:  -MATURE ADIPOSE TISSUE CONSISTENT WITH LIPOMA.     Question to Physician:  Please clarify size of lipoma excised for coding purposes  (Diameter of lesion excised including margins for each site )   A ) neck lipoma  1) 0.5 cm or less  2 )0.6 to 1.0 cm  3) 1.1 to 2.0 cm  4) 2.1 to 3/30 cm  5) 3.1 to 4.0 cm  6) over 4.0 cm  B) thigh lipoma  1) 0.5 cm or less  2 )0.6 to 1.0 cm  3) 1.1 to 2.0 cm  4) 2.1 to 3/30 cm  5) 3.1 to 4.0 cm  6) over 4.0 cm  Thank you for your time       PHYSICIAN RESPONSE:              Coder Stana Bunting K  Date 07/18/2017         This form is considered part of the permanent legal medical record.

## 2017-07-19 NOTE — Retrospective Coding Query (Deleted)
PHYSICIAN'S DOCUMENTATION                                                                      REQUEST                                                                         Date of Request:  07/19/2017  Type of Request:  DOCUMENTATION CLARIFICATION                                         Patient Name: Kellie Fuentes, Kellie Fuentes  Account #: [id number]  MR #: [id number]  Discharge Date: 07/12/2017       Dear Dr.Mason  Your response was not visible on the account and would he please answer again    Lipomas removed form thigh and neck  Path report   A- LEFT THIGH LIPOMA; B- POSTERIOR LEFT NECK LIPOMA  Gross Description:  A- The specimen is labeled as left thigh and consists of an ovoid adipose tissue with thin capsule measuring 2.5 cm in greatest dimension. Ess 1.     B- The specimen is labeled posterior left neck and consists of two pieces of lobulated fatty tissue measuring 4 x 3.5 x 1.8 cm in aggregate. Rss 1.     07/13/17  JX/ac  Diagnosis:  A- SOFT TISSUE, LEFT THIGH, EXCISION:  -MATURE ADIPOSE TISSUE CONSISTENT WITH LIPOMA.    B- SOFT TISSUE, LEFT POSTERIOR NECK, EXCISION:  -MATURE ADIPOSE TISSUE CONSISTENT WITH LIPOMA.     Question to Physician:  Please clarify size of lipoma excised for coding purposes  (Diameter of lesion excised including margins for each site )   A ) neck lipoma  1) 0.5 cm or less  2 )0.6 to 1.0 cm  3) 1.1 to 2.0 cm  4) 2.1 to 3/30 cm  5) 3.1 to 4.0 cm  6) over 4.0 cm  B) thigh lipoma  1) 0.5 cm or less  2 )0.6 to 1.0 cm  3) 1.1 to 2.0 cm  4) 2.1 to 3/30 cm  5) 3.1 to 4.0 cm  6) over 4.0 cm  Thank you for your time    Question to Physician:          PHYSICIAN RESPONSE:              Coder Stana Bunting K  Date 07/19/2017         This form is considered part of the permanent legal medical record.

## 2017-07-20 NOTE — Retrospective Coding Query (Signed)
PHYSICIAN'S DOCUMENTATION                                                                      REQUEST                                                                         Date of Request:  07/20/2017  Type of Request:  DOCUMENTATION CLARIFICATION                                         Patient Name: Kellie Fuentes, Kellie Fuentes  Account #: [id number]  MR #: [id number]  Discharge Date: 07/12/2017       Dear Dr. Marlene Bast    Lipomas removed from neck  Path   B- POSTERIOR LEFT NECK LIPOMA    The specimen is labeled posterior left neck and consists of two pieces of lobulated fatty tissue measuring 4 x 3.5 x 1.8 cm in aggregate. Rss 1.        SOFT TISSUE, LEFT POSTERIOR NECK, EXCISION:  -MATURE ADIPOSE TISSUE CONSISTENT WITH LIPOMA.     Question to Physician:  Please clarify size of lipoma excised for coding purposes (Diameter of lesion excised including margins for each site )   A ) neck lipoma  1) 0.5 cm or less  2 )0.6 to 1.0 cm  3) 1.1 to 2.0 cm  4) 2.1 to 3/30 cm  5) 3.1 to 4.0 cm  6) over 4.0 cm  Thank you for your time      PHYSICIAN RESPONSE:              Coder Stana Bunting K  Date 07/20/2017         This form is considered part of the permanent legal medical record

## 2017-08-24 NOTE — Retrospective Coding Query (Signed)
PHYSICIAN'S DOCUMENTATION                                                                      REQUEST                                                                         Date of Request:  08/24/2017  Type of Request:  DOCUMENTATION CLARIFICATION                                         Patient Name: Kellie Fuentes, Kellie Fuentes  Account #: [id number]  MR #: [id number]  Discharge Date: 07/12/2017       Dear Dr. Marlene Bast    Lipomas removed from neck  Path   B- POSTERIOR LEFT NECK LIPOMA    The specimen is labeled posterior left neck and consists of two pieces of lobulated fatty tissue measuring 4 x 3.5 x 1.8 cm in aggregate. Rss 1.       SOFT TISSUE, LEFT POSTERIOR NECK, EXCISION:  -MATURE ADIPOSE TISSUE CONSISTENT WITH LIPOMA.           Question to Physician:    Please clarify size of lipoma excised for coding purposes (Diameter of lesion excised including margins for each site )   A ) neck lipoma  1) 0.5 cm or less  2 )0.6 to 1.0 cm  3) 1.1 to 2.0 cm  4) 2.1 to 3/30 cm  5) 3.1 to 4.0 cm  6) over 4.0 cm  Thank you for your time          PHYSICIAN RESPONSE:        #5      Coder Stana Bunting K  Date 08/24/2017         This form is considered part of the permanent legal medical record.

## 2018-02-08 ENCOUNTER — Other Ambulatory Visit (INDEPENDENT_AMBULATORY_CARE_PROVIDER_SITE_OTHER): Payer: Self-pay | Admitting: Internal Medicine

## 2018-02-08 DIAGNOSIS — B009 Herpesviral infection, unspecified: Secondary | ICD-10-CM

## 2018-03-30 IMAGING — CT CT ABDOMEN AND PELVIS WITH CONTRAST
2 of 3 series · 16 of 46 positions shown, 18 images · IV contrast (APPLIED)
Comparison: There are no previous exams available for comparison.

CT ABDOMEN AND PELVIS WITH CONTRAST, 03/30/2018 [DATE]: 
CLINICAL INDICATION:  Abdominal pain. Bloating. Right sided for 6 weeks. 
A search for DICOM formatted images was conducted for prior CT imaging studies 
completed at a non-affiliated media free facility.
TECHNIQUE: The abdomen and pelvis were scanned from lung bases through the 
pubic rami with 100 cc's of Isovue 300 injected intravenously on a 
high-resolution Ct scanner using dose reduction techniques.  Routine MPR 
reconstructions were performed.

[Series 4: axial · axial · 0.77mm/px · z∈[-465,-72]mm · 13 of 151 slices shown, 15 images]
[im 10/151  soft-tissue]
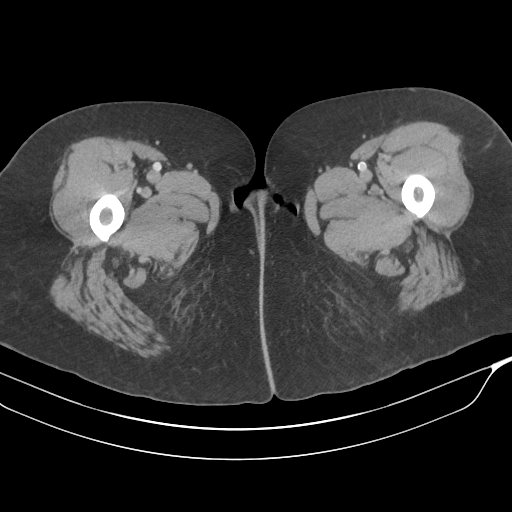
[im 10/151  bone]
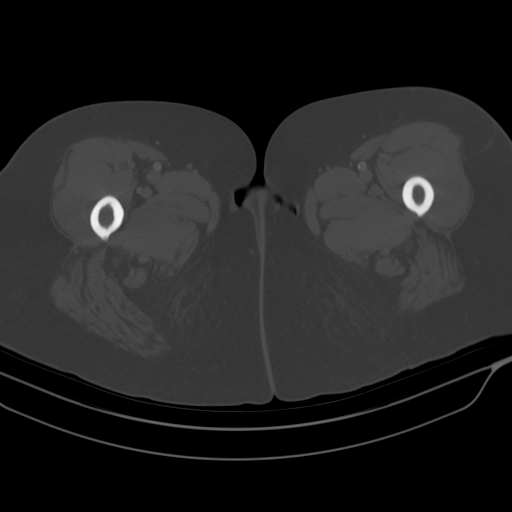
[im 20/151  soft-tissue]
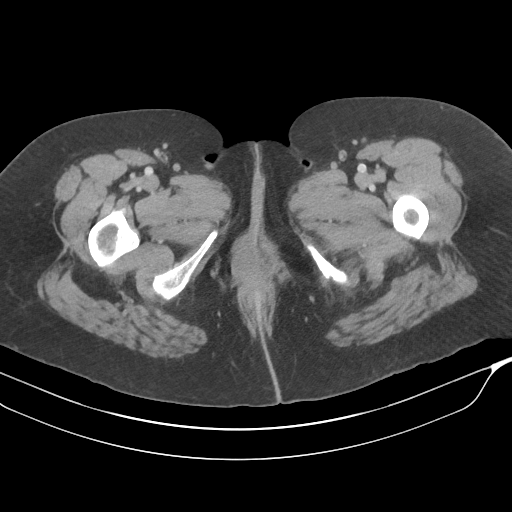
[im 30/151  soft-tissue]
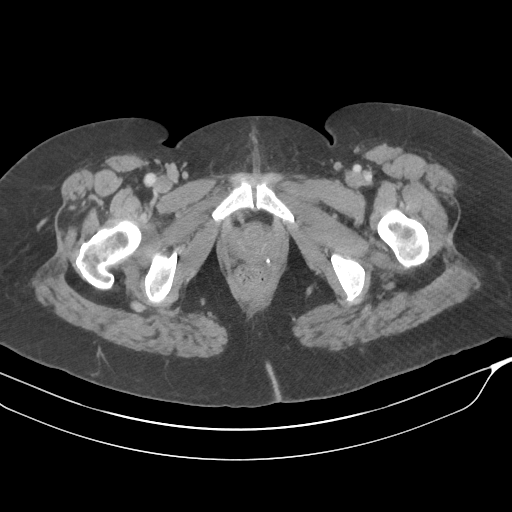
[im 44/151  soft-tissue]
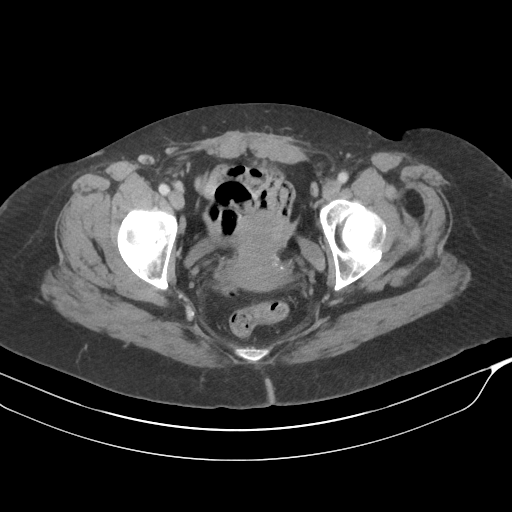
[im 54/151  soft-tissue]
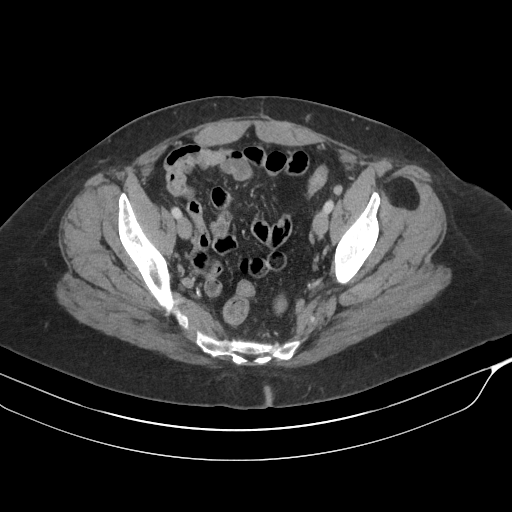
[im 63/151  soft-tissue]
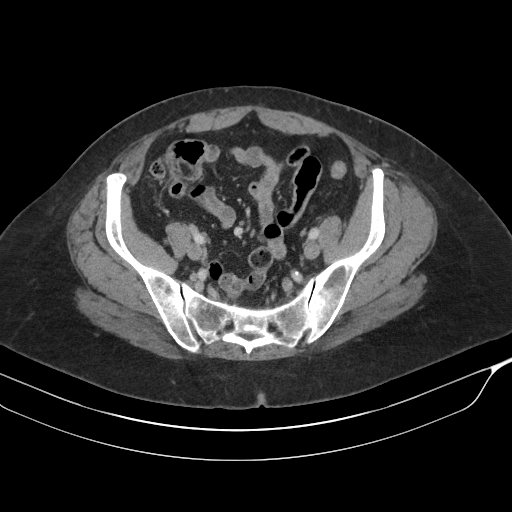
[im 78/151  soft-tissue]
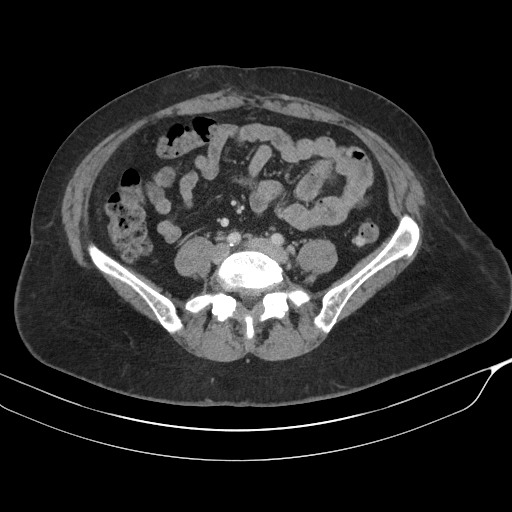
[im 88/151  soft-tissue]
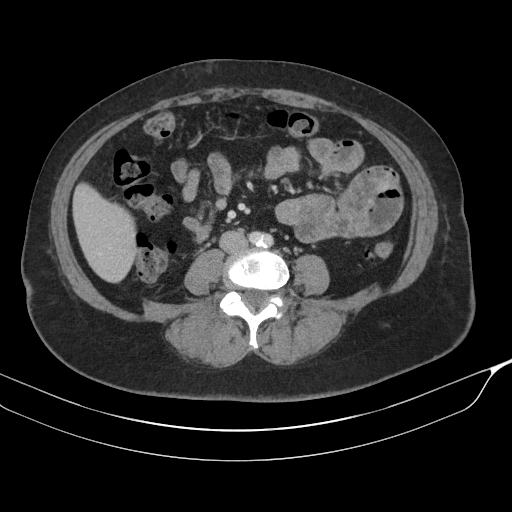
[im 97/151  soft-tissue]
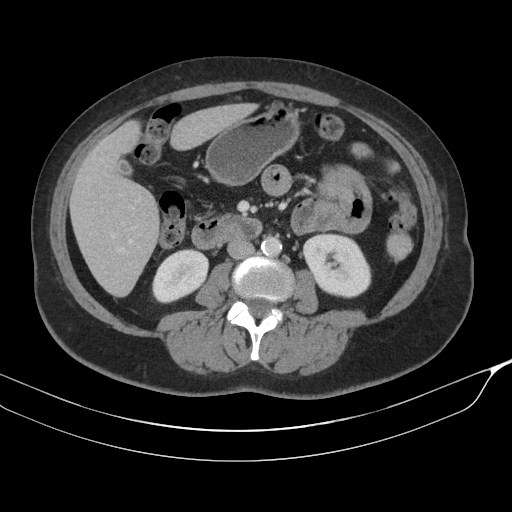
[im 97/151  bone]
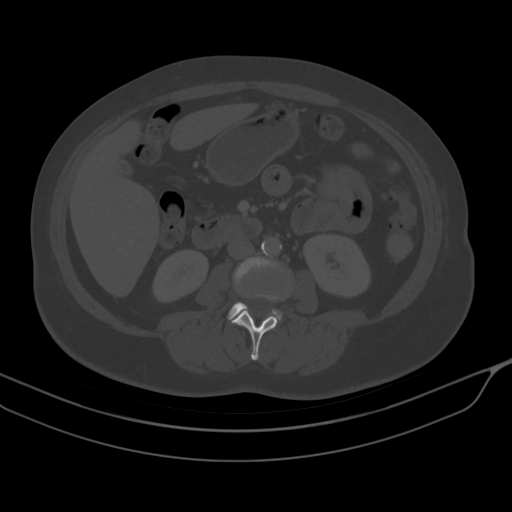
[im 107/151  soft-tissue]
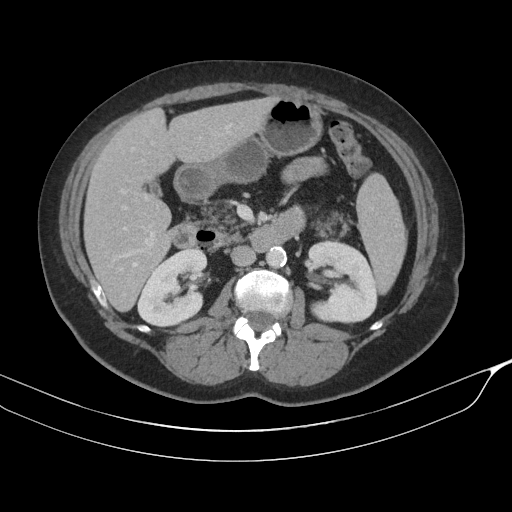
[im 121/151  soft-tissue]
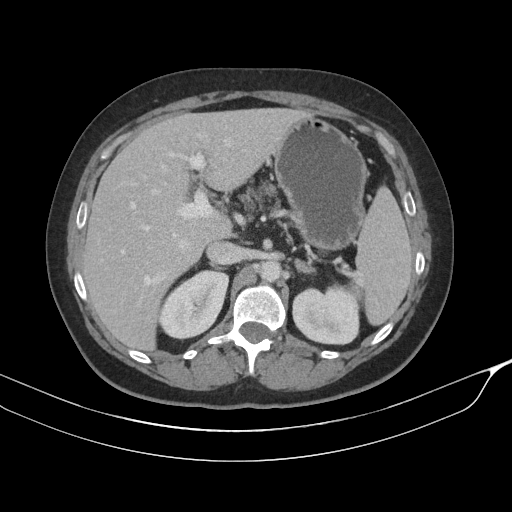
[im 131/151  soft-tissue]
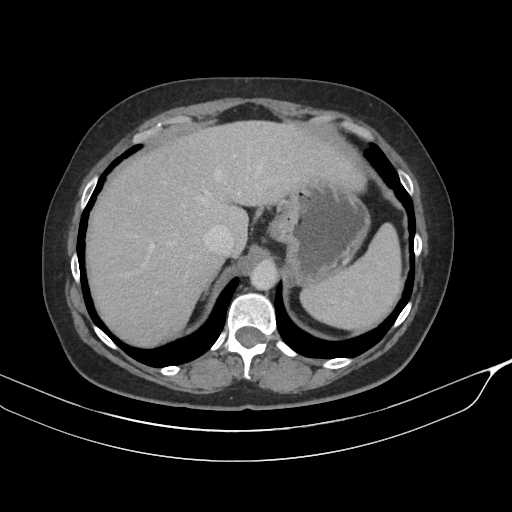
[im 141/151  soft-tissue]
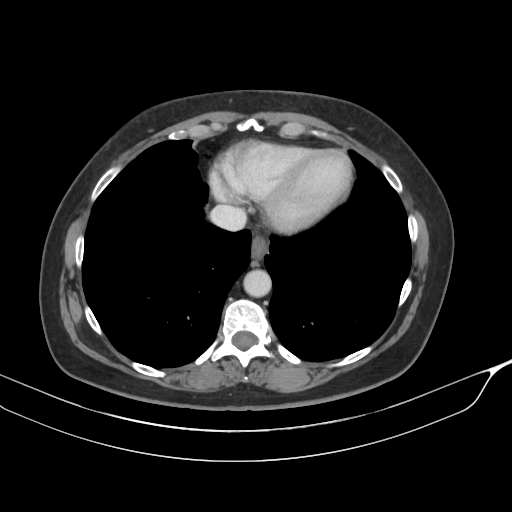

[Series 5: cor · coronal · 0.85mm/px · 3 of 131 slices shown]
[im 44/131  soft-tissue]
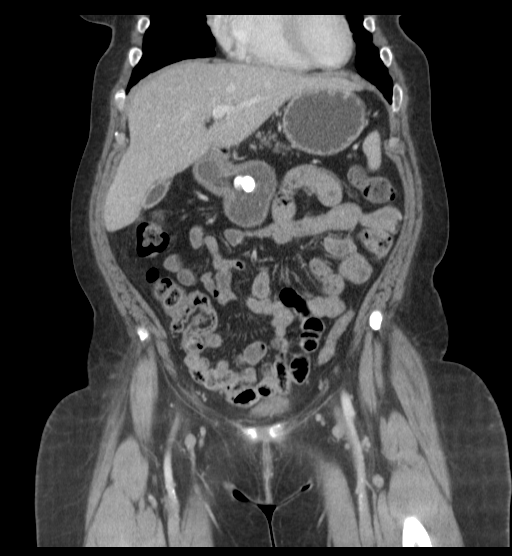
[im 58/131  soft-tissue]
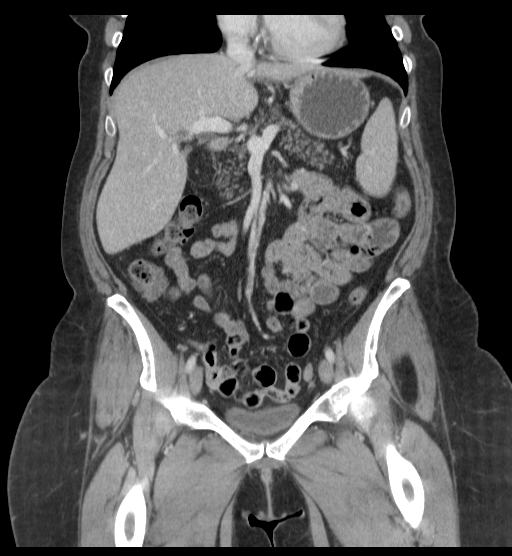
[im 73/131  soft-tissue]
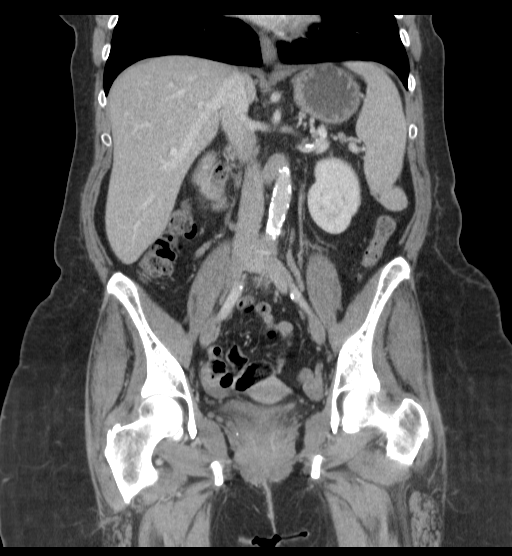

[16 of 46 positions shown; findings below may reference images not displayed]

FINDINGS: ABDOMEN: Lung bases appear unremarkable. Bone windows demonstrate no osseous 
abnormality. Liver is enlarged measuring 18.3 cm vertical length. Diffuse 
fatty 
infiltration. No focal hepatic mass or intrahepatic bullae dilatation 
identified. Portal vein upper normal in size. Pancreas is fatty infiltrated. 
The 
stomach, spleen, adrenal glands, and gallbladder appear unremarkable. No 
ascites 
or adenopathy. Aorta is normal in course and caliber. Renal function is 
present 
bilaterally without evidence of obstruction, nephrolithiasis, or abnormal 
renal 
mass. Mesenteric fat as well as surrounding the loops intact. 
PELVIS: Pelvic sidewalls are intact. Uterus and ovaries unremarkable. Bladder 
is 
intact. Inguinal regions unremarkable. Appendix appears unremarkable.
IMPRESSION: 1.  Hepatomegaly with diffuse fatty infiltration. Portal vein of normal size. 
2.  No acute pelvoabdominal process. 
RADIATION DOSE REDUCTION: All CT scans are performed using radiation dose 
reduction techniques, when applicable.  Technical factors are evaluated and 
adjusted to ensure appropriate moderation of exposure.  Automated dose 
management technology is applied to adjust the radiation doses to minimize 
exposure while achieving diagnostic quality images.

## 2018-07-03 ENCOUNTER — Ambulatory Visit
Admission: RE | Admit: 2018-07-03 | Discharge: 2018-07-03 | Disposition: A | Discharge status: Home / Self Care | Payer: Commercial Managed Care - POS | Source: Ambulatory Visit

## 2018-07-03 DIAGNOSIS — Z6829 Body mass index (BMI) 29.0-29.9, adult: Secondary | ICD-10-CM

## 2018-07-03 DIAGNOSIS — M329 Systemic lupus erythematosus, unspecified: Secondary | ICD-10-CM

## 2018-07-03 DIAGNOSIS — M150 Primary generalized (osteo)arthritis: Secondary | ICD-10-CM

## 2018-07-03 DIAGNOSIS — R21 Rash and other nonspecific skin eruption: Secondary | ICD-10-CM

## 2018-07-03 DIAGNOSIS — M1711 Unilateral primary osteoarthritis, right knee: Secondary | ICD-10-CM | POA: Insufficient documentation

## 2018-07-03 DIAGNOSIS — M25561 Pain in right knee: Secondary | ICD-10-CM

## 2019-08-26 ENCOUNTER — Emergency Department: Payer: Medicare Other

## 2019-08-26 ENCOUNTER — Emergency Department
Admission: EM | Admit: 2019-08-26 | Discharge: 2019-08-26 | Disposition: A | Discharge status: Home / Self Care | Payer: Medicare Other | Attending: Physician Assistant | Admitting: Physician Assistant

## 2019-08-26 DIAGNOSIS — S61311A Laceration without foreign body of left index finger with damage to nail, initial encounter: Secondary | ICD-10-CM

## 2019-08-26 DIAGNOSIS — S62631B Displaced fracture of distal phalanx of left index finger, initial encounter for open fracture: Secondary | ICD-10-CM | POA: Insufficient documentation

## 2019-08-26 DIAGNOSIS — W230XXA Caught, crushed, jammed, or pinched between moving objects, initial encounter: Secondary | ICD-10-CM | POA: Insufficient documentation

## 2019-08-26 DIAGNOSIS — S62639B Displaced fracture of distal phalanx of unspecified finger, initial encounter for open fracture: Secondary | ICD-10-CM

## 2019-08-26 MED ORDER — BACITRACIN +/- ZINC 500 UNIT/GM EX OINT (WRAP)
TOPICAL_OINTMENT | CUTANEOUS | Status: AC
Start: 2019-08-26 — End: ?
  Filled 2019-08-26: qty 1

## 2019-08-26 MED ORDER — CEPHALEXIN 500 MG PO CAPS
500.00 mg | ORAL_CAPSULE | Freq: Once | ORAL | Status: AC
Start: 2019-08-26 — End: 2019-08-26
  Administered 2019-08-26: 19:00:00 500 mg via ORAL

## 2019-08-26 MED ORDER — ACETAMINOPHEN 500 MG PO TABS
1000.0000 mg | ORAL_TABLET | Freq: Once | ORAL | Status: AC
Start: 2019-08-26 — End: 2019-08-26
  Administered 2019-08-26: 18:00:00 1000 mg via ORAL

## 2019-08-26 MED ORDER — ACETAMINOPHEN 500 MG PO TABS
ORAL_TABLET | ORAL | Status: AC
Start: 2019-08-26 — End: ?
  Filled 2019-08-26: qty 2

## 2019-08-26 MED ORDER — CEPHALEXIN 500 MG PO CAPS
500.00 mg | ORAL_CAPSULE | Freq: Four times a day (QID) | ORAL | 0 refills | Status: AC
Start: 2019-08-26 — End: 2019-09-02

## 2019-08-26 MED ORDER — LIDOCAINE HCL 1 % IJ SOLN
5.00 mL | Freq: Once | INTRAMUSCULAR | Status: AC
Start: 2019-08-26 — End: 2019-08-26
  Administered 2019-08-26: 19:00:00 5 mL via INTRADERMAL

## 2019-08-26 MED ORDER — CEPHALEXIN 500 MG PO CAPS
ORAL_CAPSULE | ORAL | Status: AC
Start: 2019-08-26 — End: ?
  Filled 2019-08-26: qty 1

## 2019-08-26 MED ORDER — LIDOCAINE HCL (PF) 1 % IJ SOLN
INTRAMUSCULAR | Status: AC
Start: 2019-08-26 — End: ?
  Filled 2019-08-26: qty 5

## 2019-08-26 MED ORDER — BACITRACIN +/- ZINC 500 UNIT/GM EX OINT (WRAP)
TOPICAL_OINTMENT | Freq: Once | CUTANEOUS | Status: AC
Start: 2019-08-26 — End: 2019-08-26
  Administered 2019-08-26: 20:00:00 1 g via TOPICAL

## 2019-08-26 NOTE — ED Triage Notes (Signed)
Chief Complaint   Patient presents with   . Finger laceration     Past Medical History:   Diagnosis Date   . 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 Gastroesophageal reflux disease    . Hyperlipidemia    . Hypertension     stable on meds   . Hypothyroidism    . Irritable bowel syndrome    . Seasonal allergies    . Systemic lupus erythematosus    . Type 1 diabetes mellitus      Pulse 72   Temp 98.5 F (36.9 C) (Oral)   Resp 20   Wt 73.3 kg   LMP  (LMP Unknown)   SpO2 97%   BMI 28.63 kg/m

## 2019-08-26 NOTE — ED Provider Notes (Signed)
[facility]   EMERGENCY DEPARTMENT    Pt Name: Kellie Fuentes, Kellie Fuentes DOB:[birth year] Age: 63 y.o. female   MRN:  [id number] Encounter Date:  08/26/2019   ED PA: Judson Roch, DMSc, PA-C ED Attending: Justice Britain, *   Room:  E6/ED6-A PCP: Vianne Bulls, MD     Diagnosis / Disposition     Clinical Impression  1. Laceration of left index finger without foreign body with damage to nail, initial encounter    2. Open fracture of tuft of distal phalanx of finger        Disposition  ED Disposition     ED Disposition Condition Date/Time Comment    Discharge  Sun Aug 26, 2019  7:32 PM [facility] discharge to home/self care.    Condition at disposition: Stable          Follow Up  Dion Body, MD  [address]  [place] [address]  [phone]          [facility] Emergency Department  [address]  [place] [address]  [phone]    8-10 days, For suture removal      Prescriptions  New Prescriptions    CEPHALEXIN (KEFLEX) 500 MG CAPSULE    Take 1 capsule (500 mg total) by mouth 4 (four) times daily for 7 days       MDM / Course in ED     Medications   acetaminophen (TYLENOL) tablet 1,000 mg (1,000 mg Oral Given 08/26/19 1820)   lidocaine (XYLOCAINE) 1 % injection 5 mL (5 mLs Intradermal Given 08/26/19 1911)   bacitracin ointment (1 g Topical Given 08/26/19 1933)   cephalexin (KEFLEX) capsule 500 mg (500 mg Oral Given 08/26/19 1907)   acetaminophen (TYLENOL) 500 MG tablet (has no administration in time range)   lidocaine (PF) (XYLOCAINE) 1 % injection (has no administration in time range)   cephalexin (KEFLEX) 500 MG capsule (has no administration in time range)   bacitracin 500 UNIT/GM ointment (has no administration in time range)       Orders Placed This Encounter   Procedures   . Apply splint/immobilizer - finger splint fold over   . XR Finger Right Minimum 2 Vw            DDx: Laceration, abrasion, contusion, fracture, dislocation, possible need for  tetanus booster  There is laceration with distal tuft fracture.  Tetanus is current.  Wound was cleansed and irrigated.  Wound was repaired with sutures.  Wound care instructions were discussed.  Bacitracin applied and splint applied.  Suture removal in 8 to 10 days. Discussed reasons to return to the ED. Patient verbalized understanding.  All questions answered to patient's satisfaction.  Patient in agreement with this plan.       Subjective   History of Presenting Illness   Chief complaint: Finger laceration    HPI/ROS is limited by: none  HPI/ROS given by: patient, husband    Location: left index finger  Quality: injury  Duration: just PTA  Severity: constant  Pain Score: 7/10  Aggravating: slammed in door  Alleviating: none    Kellie Fuentes is a 63 y.o. female who presents with injury to left index finger that occurred just prior to emergency department arrival.  Patient accidentally slammed her index finger in a door.  Denies any other injuries.  Last tetanus 1 to 2 years ago.  Review of Systems     Review of  Systems   Constitutional: Negative for chills and fever.   Musculoskeletal: Positive for arthralgias. Negative for joint swelling.   Skin: Positive for wound.   Hematological: Negative.    All other systems reviewed and are negative.      Allergies/Medications     Allergies   Allergen Reactions   . Tramadol Itching     Itching on body   . Penicillins Rash     Constricts airway   . Ampicillin Rash     Constricts airway       Current/Home Medications    ALPRAZOLAM (XANAX) 0.5 MG TABLET    TAKE ONE TABLET BY MOUTH AT NIGHT AS NEEDED FOR SLEEP    ARMODAFINIL 150 MG TABLET    Take 150 mg by mouth as needed.        B COMPLEX VITAMINS TABLET    Take 1 tablet by mouth daily.    CYPROHEPTADINE (PERIACTIN) 4 MG TABLET    Take 4 mg by mouth 3 (three) times daily as needed.    FLUTICASONE (CUTIVATE) 0.05 % CREAM    Apply topically daily.       GLIMEPIRIDE (AMARYL) 2 MG TABLET    Take 2 mg by mouth 2 (two) times  daily.    IBUPROFEN (ADVIL,MOTRIN) 800 MG TABLET    Take 1 tablet (800 mg total) by mouth every 6 (six) hours as needed for Pain.    INSULIN GLARGINE (BASAGLAR KWIKPEN) 100 UNIT/ML INJECTION PEN    Inject 60 Units into the skin daily.    IRBESARTAN (AVAPRO) 75 MG TABLET    TAKE ONE TABLET BY MOUTH DAILY    LEVOTHYROXINE (SYNTHROID, LEVOTHROID) 75 MCG TABLET    Take 1 tablet (75 mcg total) by mouth Once a day at 6:00am.    MAGNESIUM 500 MG CAP    Take 250 mg by mouth every morning before breakfast.        MAGNESIUM CL-CALCIUM CARBONATE (SLOW-MAG PO)    Take by mouth nightly.        METHSCOPOLAMINE (PAMINE) 2.5 MG TAB    Take 2.5 mg by mouth as needed.        NALOXONE (NARCAN) 4 MG/0.1ML LIQUID NASAL SPRAY    1 spray intranasally. If pt does not respond or relapses into respiratory depression call 911. Give additional doses every 2-3 min.    TRAMADOL (ULTRAM) 50 MG TABLET    Take 50 mg by mouth every 8 (eight) hours as needed for Pain.    VALACYCLOVIR HCL (VALTREX) 500 MG TABLET    Take 1 tablet (500 mg total) by mouth daily as needed (HSV).    VITAMIN D, CHOLECALCIFEROL, PO    Take by mouth daily.           Past History     Medical: Pt has a past medical history of Diabetes mellitus, Gastroesophageal reflux disease, Hyperlipidemia, Hypertension, Hypothyroidism, Irritable bowel syndrome, Seasonal allergies, Systemic lupus erythematosus, and Type 1 diabetes mellitus.    Surgical: Pt has a past surgical history that includes Knee surgery; LAPAROSCOPY, DIAGNOSTIC; Breast surgery (Bilateral); EGD, COLONOSCOPY; Eye surgery (Bilateral); Cyst Removal; ABDOMINOPLASTY, LIPOSUCTION (COSMETIC) (N/A, 10/15/2016); and EXCISION, LIPOMA (N/A, 07/12/2017).    Family: Thefamily history includes Arthritis in her mother; Cancer in her father and mother; Heart disease in her father; Thyroid disease in her mother.    Social: Pt reports that she has quit smoking. She has never used smokeless tobacco. She reports current alcohol use of about  1.0 standard  drinks of alcohol per week. She reports that she does not use drugs.         Objective     Physical Exam     Vitals:    08/26/19 1733 08/26/19 1909   BP: (!) 150/99 153/56   Pulse: 72 63   Resp: 20 16   Temp: 98.5 F (36.9 C)    TempSrc: Oral    SpO2: 97% 100%   Weight: 73.3 kg        Physical Exam   Constitutional: She is oriented to person, place, and time. She appears well-developed and well-nourished. No distress.   HENT:   Head: Normocephalic and atraumatic.   Pulmonary/Chest: Effort normal. No respiratory distress.   Musculoskeletal:        Hands:    Neurological: She is alert and oriented to person, place, and time.   Skin: Skin is warm and dry.   Nursing note and vitals reviewed.      Diagnostic Results     The results of the diagnostic studies below have been reviewed by myself:    Radiologic Studies  Xr Finger Right Minimum 2 Vw    Result Date: 08/26/2019  1.  Possible nondisplaced fracture of the distal phalanx. This would be consistent with open fracture given the adjacent soft tissue injury. ReadingStation:[id number]      Lab Studies  Results     ** No results found for the last 24 hours. **          EKG:        Procedures     LACERATION REPAIR   By Eino Farber, PA  7:05 PM    Location: left index finger  Length: 1.5 cm  Layers: single  Contamination: dirty  Suture(s): #3 5-0 nylon    Verbal consent.  Patient identified and location of wound verified.  Tetanus is up-to-date.  Appropriate sterile precautions observed with saline, Chlorhexadine, gloves and sterile drape.  Lidocaine 1% given locally.  Wound and surrounding structures checked and no further trauma found. No FBs seen.  Hemostasis achieved post procedure. Patient tolerated procedure well with no apparent complications. Bacitracin and clean dry dressing applied post procedure.    Critical Care              Judson Roch  DMSc, PA-C, EM-CAQ    Note:  This chart was generated by the Epic EMR system/ speech recognition and may  contain inherent errors, including typographical, or omissions not intended by the user.    In addition to the above history, please see nursing notes. Allergies, meds, past medical, family, social, history and the results of the diagnostic studies have been reviewed by myself.          Eino Farber, [place]  08/26/19 [phone]

## 2019-08-26 NOTE — Discharge Instructions (Signed)
Open Finger Fracture  You have a broken finger (fracture) with a nearby cut, puncture, or deep scrape. This causes pain, swelling, and bruising. Because of the open injury, you are at risk for infection in the skin and bone. You will take antibiotics to lower the risk for infection.   This injury usually takes about 4 weeks to heal. Finger injuries are often treated with a splint or cast, or by taping the injured finger to the next one (buddy taping). This protects the injured finger and holds the bone in position while it heals. More serious fractures may need surgery.   If the fingernail has been severely injured, it will probably fall off in 1 to 2 weeks. A new fingernail will usually start to grow back within a month.   Home care  Follow these guidelines when caring for yourself at home:   Keep your hand elevated to reduce pain and swelling. When sitting or lying down keep your arm above the level of your heart. You can do this by placing your arm on a pillow that rests on your chest or on a pillow at your side. This is most important during the first 2 days (48 hours) after the injury.   Put an ice pack on the injured area. Do this for 20 minutes every 1 to 2 hours the first day for pain relief. You can make an ice pack by wrapping a plastic bag of ice cubes in a thin towel. As the ice melts, be careful that the cast or splint doesn't get wet. Continue using the ice pack 3 to 4 times a day until the pain and swelling go away.   Keep the cast or splint completely dry at all times. Bathe with your cast or splint out of the water. Protect it with a large plastic bag, rubber-banded at the top end. If a fiberglass cast or splint gets wet, you can dry it with a hair dryer.   You may use acetaminophen or ibuprofen to control pain, unless another pain medicine was prescribed. If you have chronic liver or kidney disease, talk with your healthcare provider before using these medicines. Also talk with your provider  if you've had a stomach ulcer or gastrointestinal bleeding.   If buddy tape was applied and it becomes wet or dirty, change it. You may replace it with paper, plastic, or cloth tape. Cloth tape and paper tapes must be kept dry. Keep the buddy tape in place for at least 4 weeks.   Take all antibiotics until you have finished them.   Don't put creams or objects under the cast if you have itching.  Follow-up care  Follow up with your healthcare provider, or as advised. This is to make sure the bone is healing the way it should.   X-rays may be taken. You will be told of any new findings that may affect your care.  When to seek medical advice  Call your healthcare provider right away if any of these occur:   The cast or splint cracks   The plaster cast or splint becomes wet or soft   The fiberglass cast or splint stays wet for more than 24 hours   Pain or swelling gets worse   Tightness or pressure under the cast or splint gets worse   Finger becomes cold, blue, numb, or tingly   You can't move your finger   Redness, warmth, swelling, drainage from the wound, or foul odor from a cast or   splint   Fever of 100.4F(38C) or higher, as directed by your healthcare provider   Shaking chills  [employer] last reviewed this educational content on 11/25/2018   2000-2020 [employer] [address], [place] [address]. All rights reserved. This information is not intended as a substitute for professional medical care. Always follow your healthcare professional's instructions.

## 2019-08-26 NOTE — ED Notes (Addendum)
Bacitracin, non adherent dressing and tube gauze placed over wound on left 2nd finger. Wound care and signs and symptoms of infection reviewed. Pt states understanding. Pt asked how long she has to keep splint on. Pt informed that ED PA recommends 3-5 weeks. Pt states that she might not keep it on that long. Pt informed that is would be best if she is able to keep splint on for recommended amount of time. 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

## 2021-06-10 DIAGNOSIS — Z1231 Encounter for screening mammogram for malignant neoplasm of breast: Secondary | ICD-10-CM

## 2021-06-26 ENCOUNTER — Encounter: Payer: Self-pay | Admitting: Family Medicine

## 2021-06-26 DIAGNOSIS — Z1231 Encounter for screening mammogram for malignant neoplasm of breast: Secondary | ICD-10-CM

## 2021-07-01 ENCOUNTER — Other Ambulatory Visit: Payer: Self-pay | Admitting: Family Medicine

## 2021-07-01 ENCOUNTER — Ambulatory Visit
Admission: RE | Admit: 2021-07-01 | Discharge: 2021-07-01 | Disposition: A | Discharge status: Home / Self Care | Payer: Medicare Other | Source: Ambulatory Visit | Attending: Family Medicine | Admitting: Family Medicine

## 2021-07-01 ENCOUNTER — Ambulatory Visit: Payer: Medicare Other

## 2021-07-01 DIAGNOSIS — Z1231 Encounter for screening mammogram for malignant neoplasm of breast: Secondary | ICD-10-CM | POA: Insufficient documentation

## 2021-07-08 ENCOUNTER — Ambulatory Visit
Admission: RE | Admit: 2021-07-08 | Discharge: 2021-07-08 | Disposition: A | Discharge status: Home / Self Care | Payer: Medicare Other | Source: Ambulatory Visit

## 2021-07-08 DIAGNOSIS — G8929 Other chronic pain: Secondary | ICD-10-CM

## 2021-07-08 DIAGNOSIS — M5431 Sciatica, right side: Secondary | ICD-10-CM

## 2021-07-08 DIAGNOSIS — M5116 Intervertebral disc disorders with radiculopathy, lumbar region: Secondary | ICD-10-CM | POA: Insufficient documentation

## 2021-07-08 DIAGNOSIS — M533 Sacrococcygeal disorders, not elsewhere classified: Secondary | ICD-10-CM

## 2021-07-08 DIAGNOSIS — M47816 Spondylosis without myelopathy or radiculopathy, lumbar region: Secondary | ICD-10-CM | POA: Insufficient documentation

## 2021-07-17 ENCOUNTER — Encounter: Payer: Self-pay | Admitting: Physician Assistant

## 2021-07-17 DIAGNOSIS — R110 Nausea: Secondary | ICD-10-CM

## 2021-07-17 DIAGNOSIS — R140 Abdominal distension (gaseous): Secondary | ICD-10-CM

## 2021-07-17 DIAGNOSIS — K581 Irritable bowel syndrome with constipation: Secondary | ICD-10-CM

## 2021-07-17 DIAGNOSIS — Z1211 Encounter for screening for malignant neoplasm of colon: Secondary | ICD-10-CM

## 2021-07-17 DIAGNOSIS — R109 Unspecified abdominal pain: Secondary | ICD-10-CM

## 2021-11-30 IMAGING — CT CT ABDOMEN AND PELVIS WITH CONTRAST
2 of 3 series · 15 of 46 positions shown, 17 images · IV contrast (ISOVUE 300)
Comparison: CT abdomen pelvis April 02, 2018 greater than 12 months ago.

________________________________________________________________________________________________ 
CT ABDOMEN AND PELVIS WITH CONTRAST, 11/30/2021 [DATE]: 
A search for DICOM formatted images was conducted for prior CT imaging studies 
completed at a non-affiliated media free facility.   
CLINICAL INDICATION: Pelvic and perineal pain. Patient describes constant LEFT 
lower quadrant pressure occasionally also on the RIGHT lower quadrant. History 
of SLE.
TECHNIQUE: The abdomen and pelvis was scanned from lung bases through the pubic 
rami with 100 mL of Isovue 300 on a high-resolution CT scanner using dose 
reduction techniques.  Routine MPR reconstructions were performed. The patients 
eGFR was calculated to be 99.7 mL/min/1.73 m2 using the i-STAT device.

[Series 4: abd/pel ax w · axial · 0.66mm/px · z∈[-460,-79]mm · 12 of 147 slices shown, 14 images]
[im 10/147  soft-tissue]
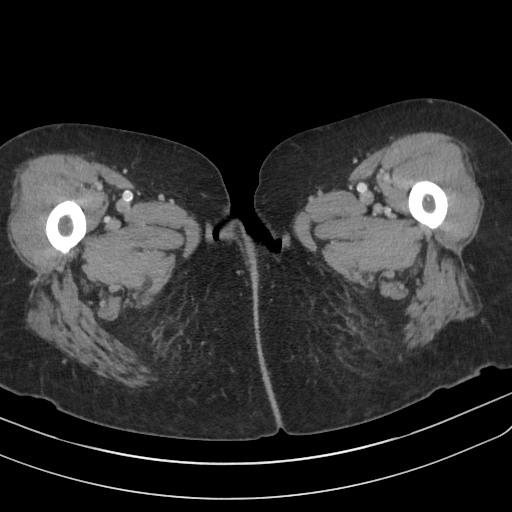
[im 10/147  bone]
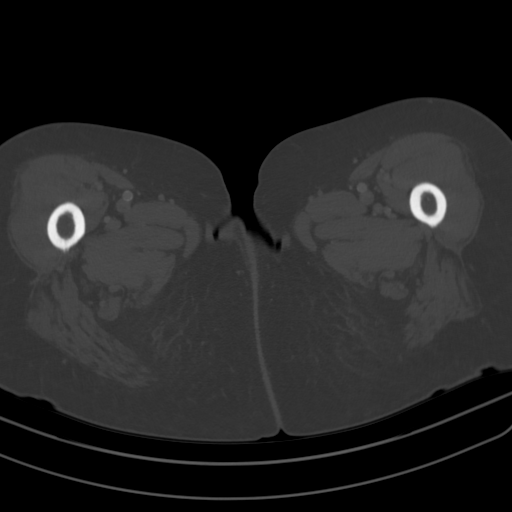
[im 19/147  soft-tissue]
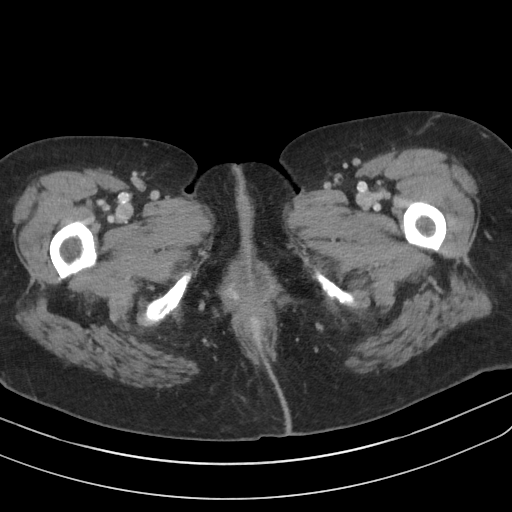
[im 33/147  soft-tissue]
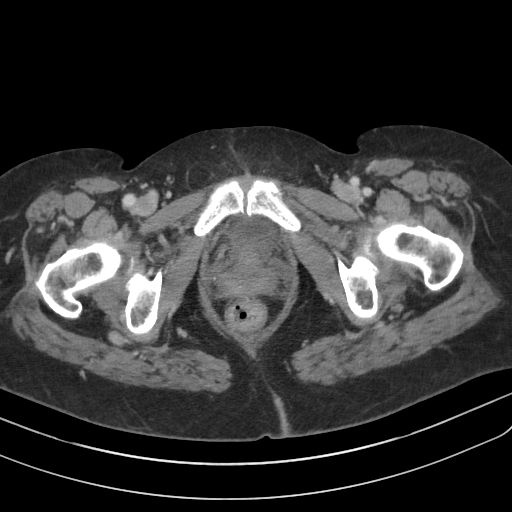
[im 43/147  soft-tissue]
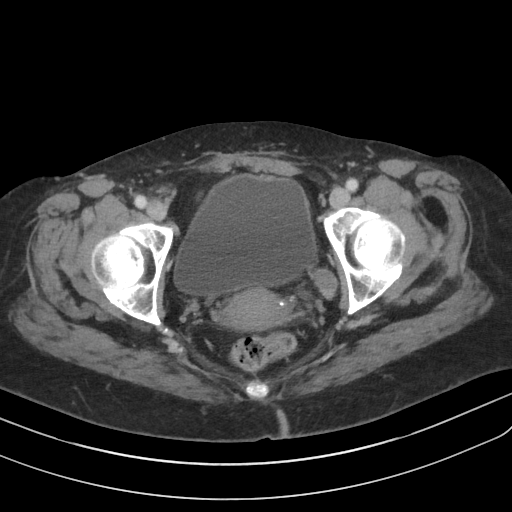
[im 57/147  soft-tissue]
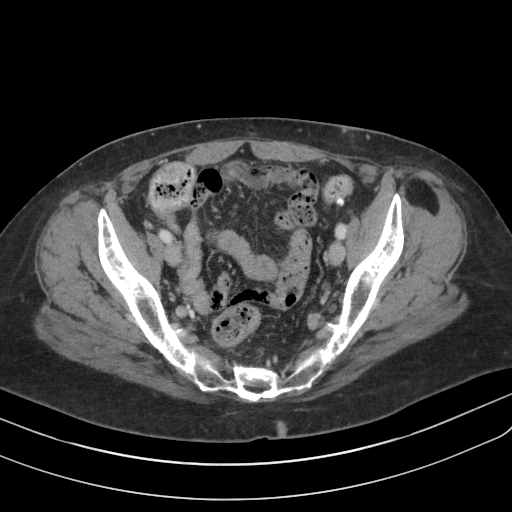
[im 66/147  soft-tissue]
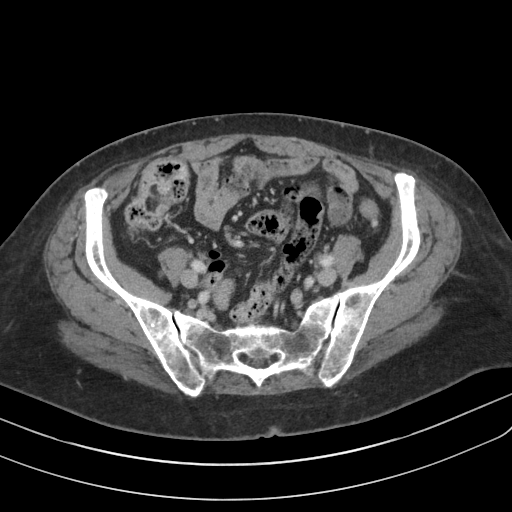
[im 81/147  soft-tissue]
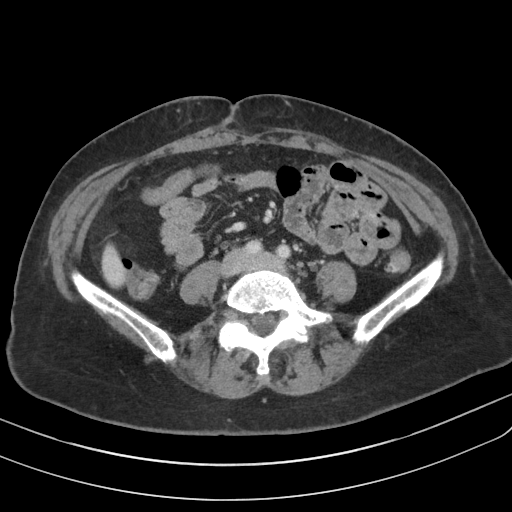
[im 90/147  soft-tissue]
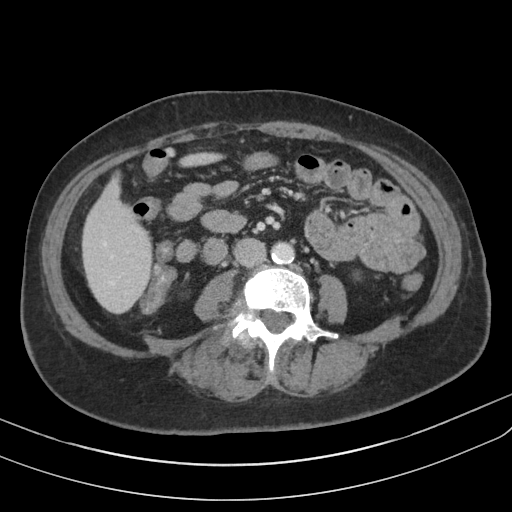
[im 104/147  soft-tissue]
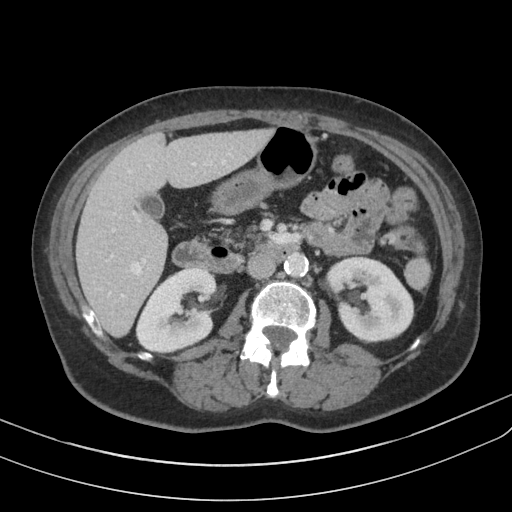
[im 104/147  bone]
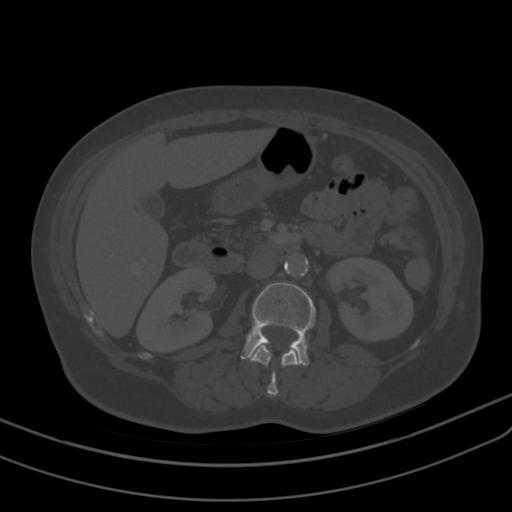
[im 114/147  soft-tissue]
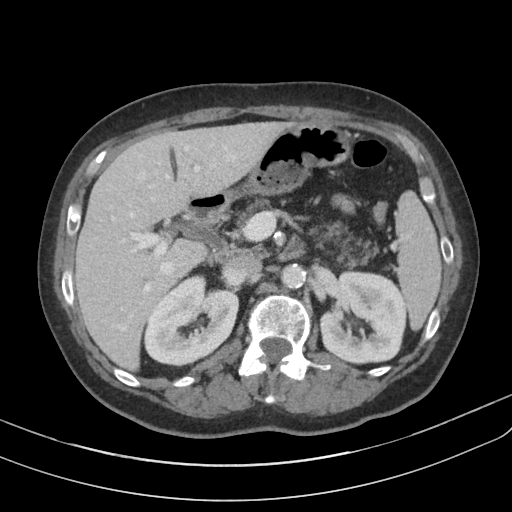
[im 128/147  soft-tissue]
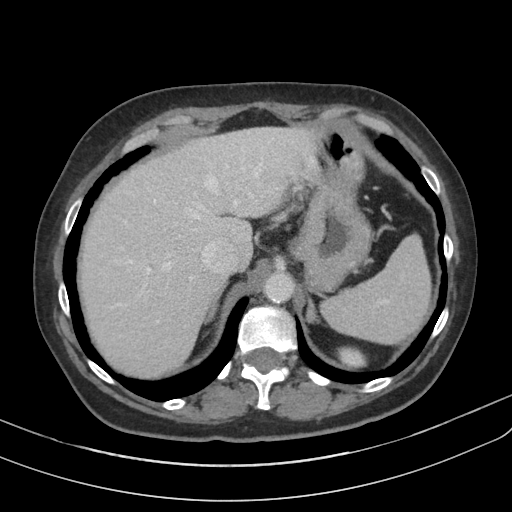
[im 137/147  soft-tissue]
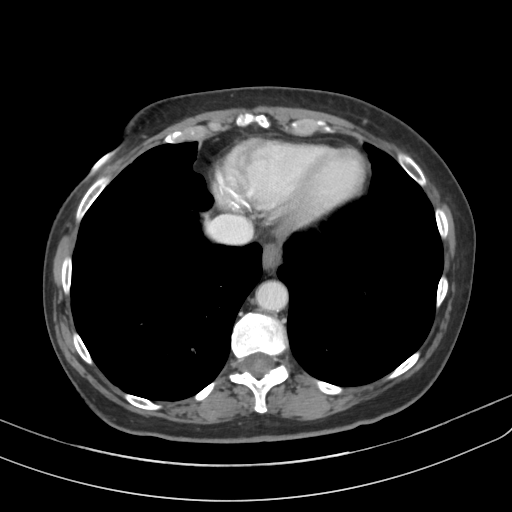

[Series 5: abd/pel cor w · coronal · 0.68mm/px · 3 of 107 slices shown]
[im 36/107  soft-tissue]
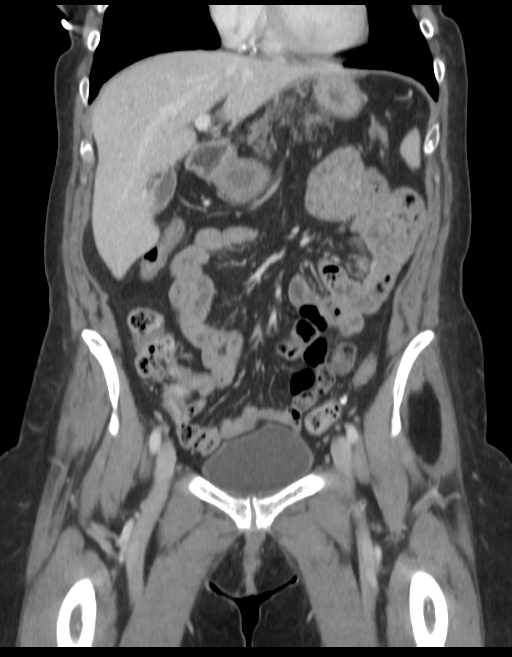
[im 48/107  soft-tissue]
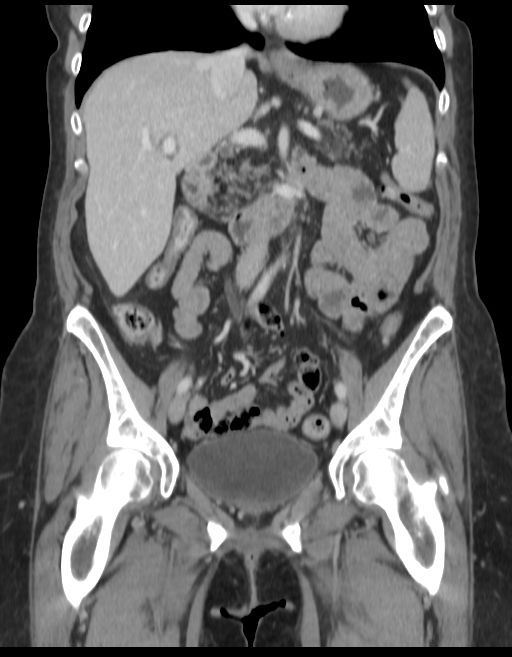
[im 59/107  soft-tissue]
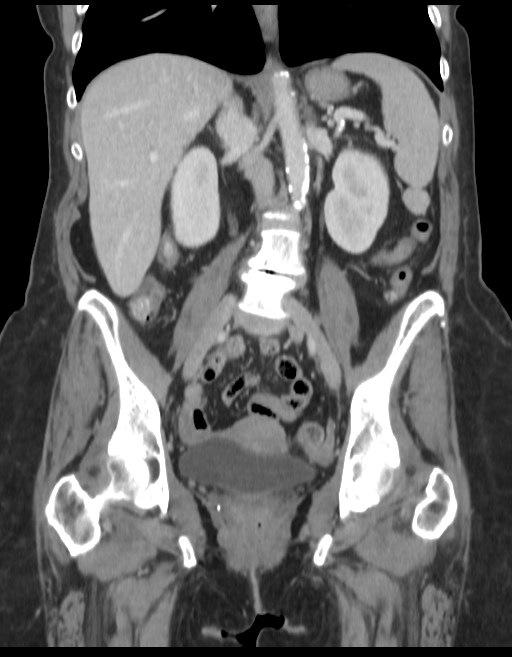

[15 of 46 positions shown; findings below may reference images not displayed]

FINDINGS: The liver is unremarkable other than minor focal fatty infiltration adjacent to 
the falciform fissure. Portal and hepatic veins are patent. The spleen is 
normal. There is diffuse fatty atrophy of the pancreatic parenchyma. The 
pancreas is otherwise unremarkable. The gallbladder and biliary tract are within 
normal limits. The adrenal glands are normal. There is a 8mm cortical cyst in 
the anterior LEFT kidney. The kidneys are otherwise unremarkable. The urinary 
bladder is normal. The uterus is within normal limits for patient age. No 
concerning adnexal abnormalities present. There is mild colonic diverticulosis. 
No acute colonic pathology is found. There is incidental duodenal diverticulum. 
The small bowel is otherwise unremarkable. The stomach is within normal limits.  
Abdominal aorta is normal in caliber. Moderate degrees of atherosclerotic 
calcifications are along the aorta and its branches. No abnormal abdominal or 
pelvic lymph nodes are found. There is no ascites. Anterior abdominal wall is 
intact. There is a lipoma incidentally noted in the LEFT hip musculature. 
Degenerative changes are in the lumbar spine with severe disc disease at L4-5 
and L5-S1. Limited imaging of the inferior thorax shows no significant 
incidental findings.
IMPRESSION: Stable exam. No acute/active abdominal or pelvic pathology is found. There are 
no findings suspicious for malignancy. 
RADIATION DOSE REDUCTION: All CT scans are performed using radiation dose 
reduction techniques, when applicable.  Technical factors are evaluated and 
adjusted to ensure appropriate moderation of exposure.  Automated dose 
management technology is applied to adjust the radiation doses to minimize 
exposure while achieving diagnostic quality images.

## 2021-12-30 IMAGING — MR MRI LEFT HIP WITH AND  WITHOUT CONTRAST
5 of 9 series · 25 of 48 positions shown · IV contrast (gadolinium)
Comparison: Radiographs the same day.

________________________________________________________________________________________________ 
MRI LEFT HIP WITH AND  WITHOUT CONTRAST, 12/30/2021 [DATE]: 
CLINICAL INDICATION: Pain across lower back for 2 years. Bilateral groin pain, 
left side worse for 2 years. Intermittent left thigh pain. Lipoma left hip 
musculature. Lupus diagnosed 35 years ago.
TECHNIQUE: Multiplanar, multiecho position MR images of the left hip were 
performed without and with intravenous gadolinium enhancement. 6 mL of Gadavist 
were injected intravenously by hand. 1.5 mL of Gadavist were discarded. 
Additional large field-of-view images were performed to include the right hip 
for comparison. 
HIPS:  Mild articular cartilaginous loss both hips. No labral tear. No 
paralabral cyst. No hip joint effusion. Both femoral heads maintain a spherical 
configuration without evidence of avascular necrosis or subarticular collapse. 
No abnormal morphology of the proximal femurs or acetabulum to predispose to 
impingement.  
BONES: Bone marrow signal intensity of the proximal femurs, pelvis, sacrum and 
lower lumbar spine is negative for fracture or pathologic marrow replacing 
lesion. Included lumbar spine demonstrates multilevel degenerative change. There 
is disc desiccation, disc height loss and discogenic marrow signal endplate 
changes. SI joints show mild degenerative change with subcortical cystic change 
of the right. 
SOFT TISSUES: There is partial tearing of the insertion of the left gluteus 
minimus with mild peritendinous edema. There is a lipoma involving the anterior 
left gluteus minimus measuring approximately 4 cm. Right abductor cuff is 
preserved. The insertions of the iliopsoas tendons are intact. The origins of 
the hamstrings are preserved. Rectus abdominis-adductor complex is preserved. 
There is metallic susceptibility artifact about the lower ventral wall 
correlating the prior history of surgical intervention.

[Series 901: survey · axial · 10.0mm · 1.08mm/px · z∈[-111,+95]mm · 2 of 9 slices shown]
[im 1/9]
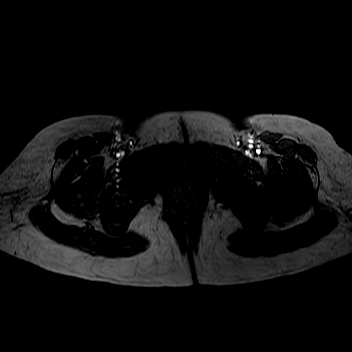
[im 9/9]
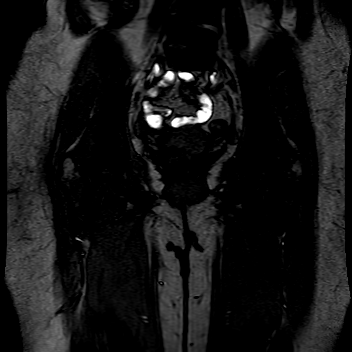

[Series 1001: stir_cor-pelvis · coronal · 5.0mm · 0.74mm/px · 6 of 30 slices shown]
[im 1/30]
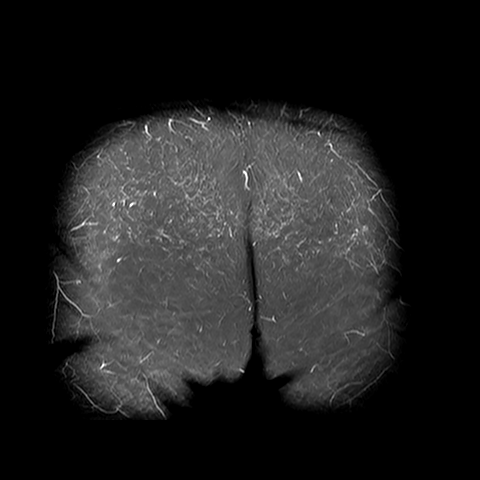
[im 6/30]
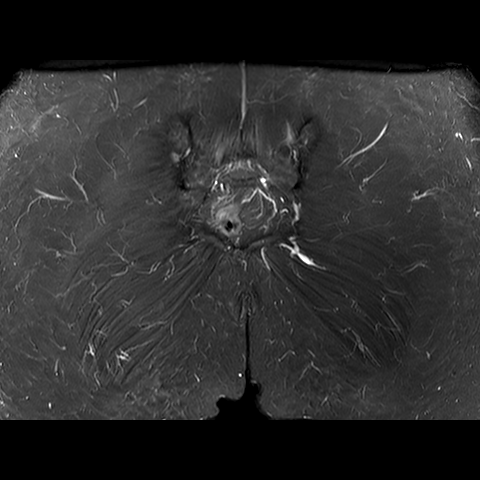
[im 12/30]
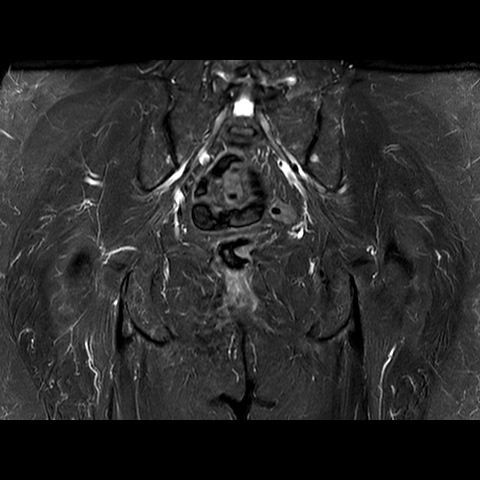
[im 18/30]
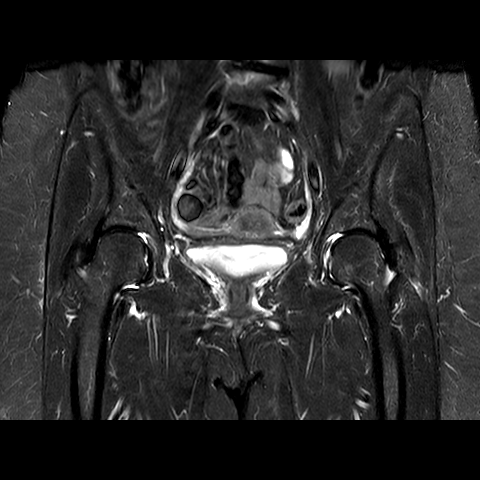
[im 24/30]
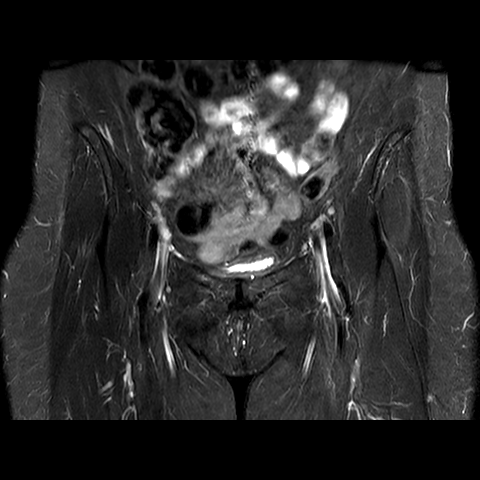
[im 30/30]
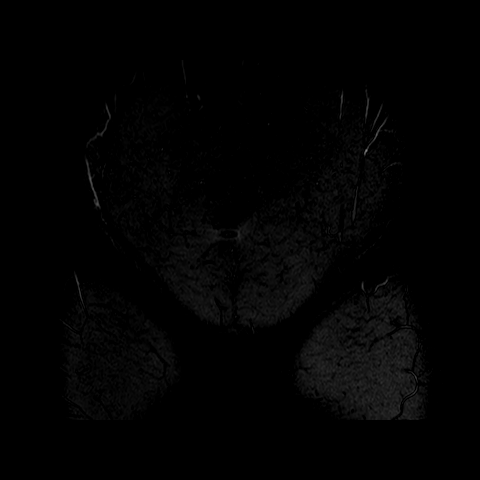

[Series 1101: t1_tra-pelvis · axial · 6.0mm · 0.78mm/px · z∈[-195,+85]mm · 8 of 36 slices shown]
[im 1/36]
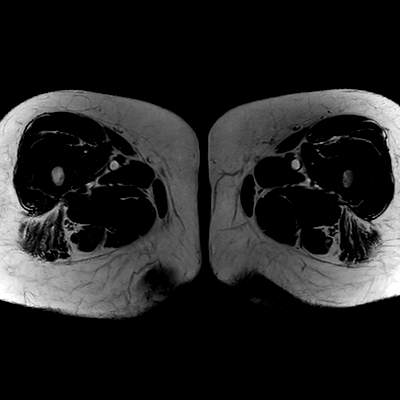
[im 6/36]
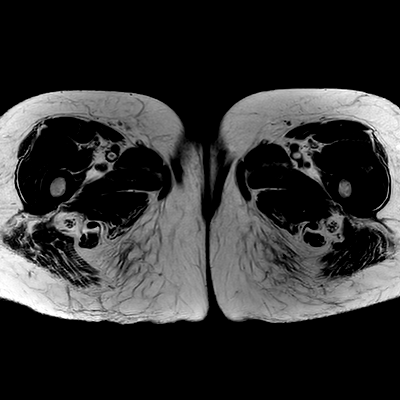
[im 11/36]
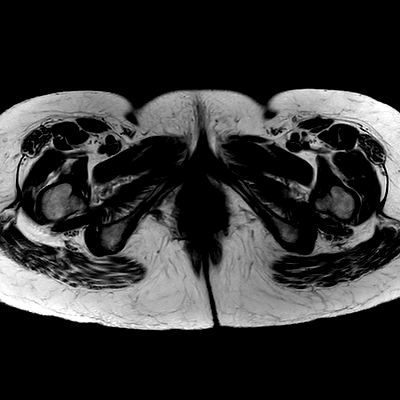
[im 16/36]
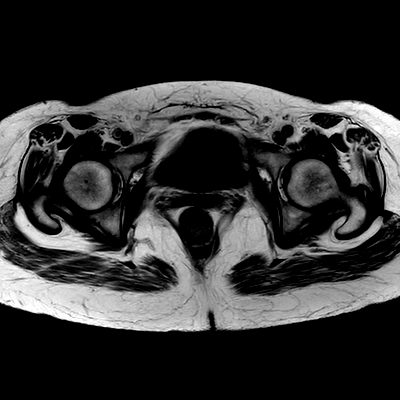
[im 21/36]
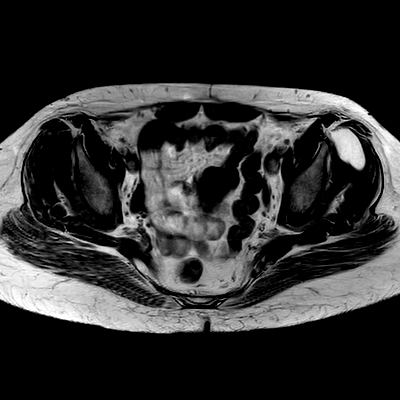
[im 26/36]
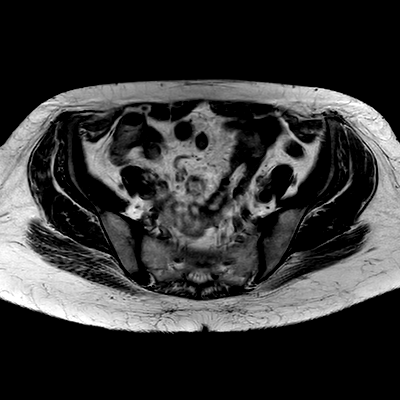
[im 31/36]
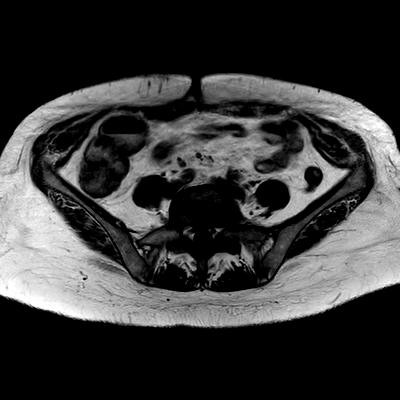
[im 36/36]
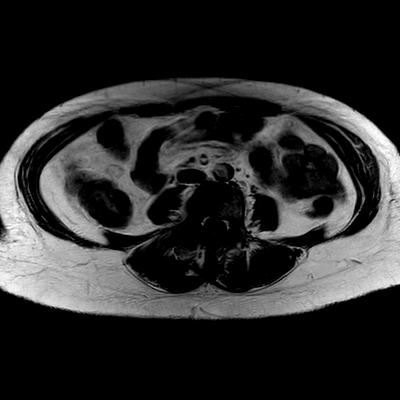

[Series 1201: pd_fs_sag · sagittal · 4.0mm · 0.46mm/px · 5 of 26 slices shown]
[im 1/26]
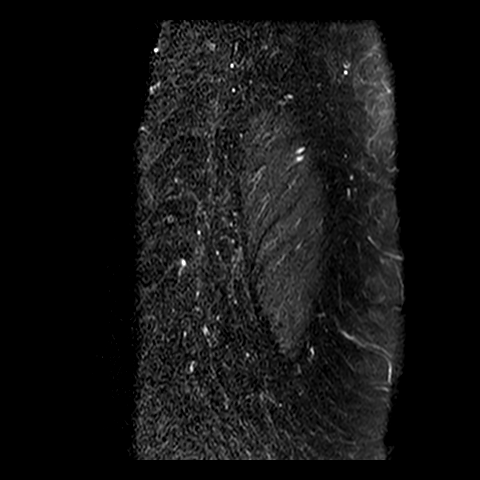
[im 7/26]
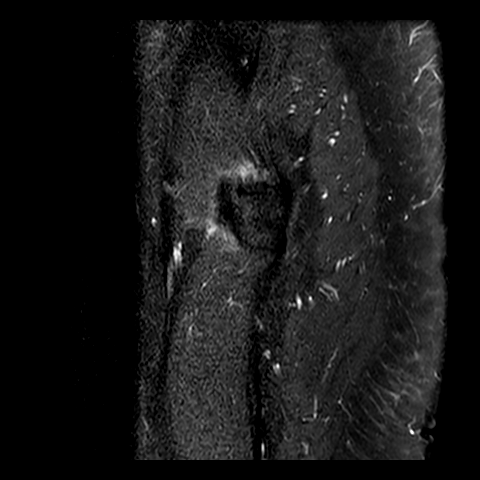
[im 13/26]
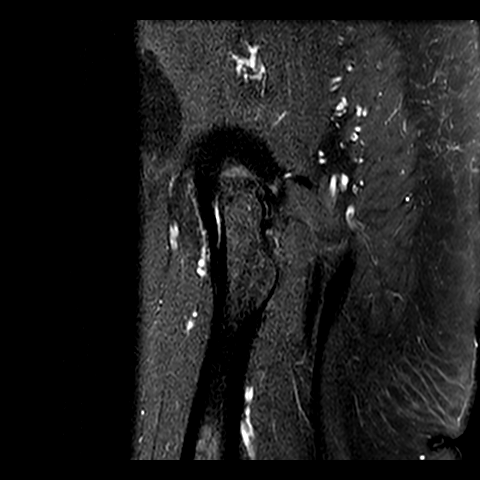
[im 19/26]
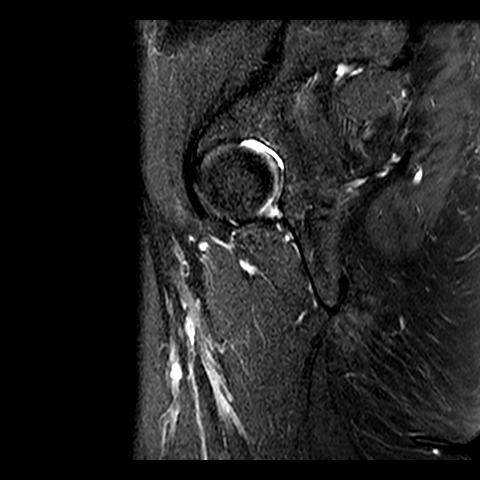
[im 26/26]
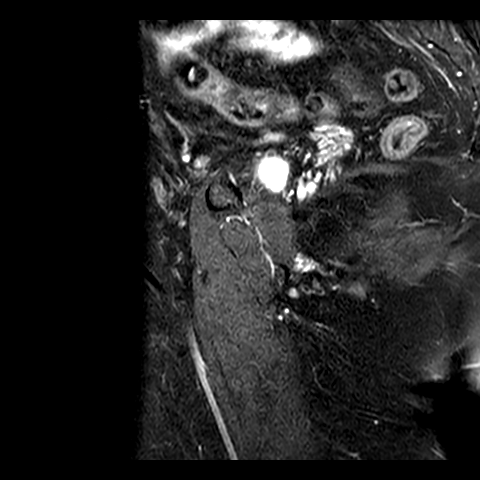

[Series 1301: pd_fs_tra_ · axial · 4.0mm · 0.47mm/px · z∈[-167,-87]mm · 4 of 28 slices shown]
[im 1/28]
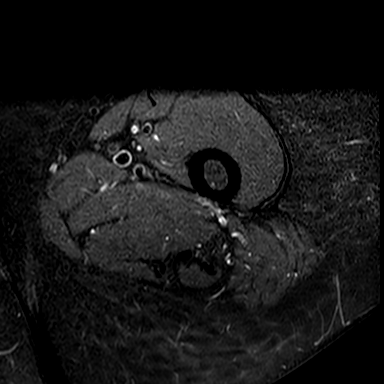
[im 6/28]
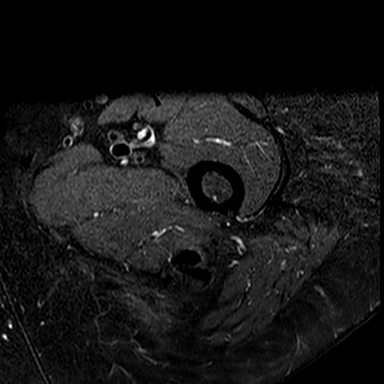
[im 11/28]
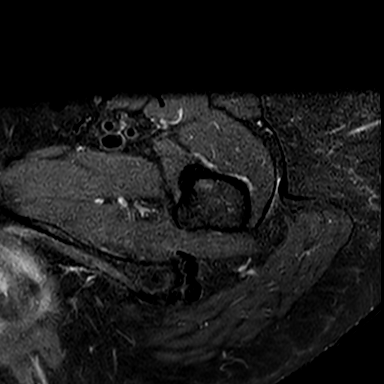
[im 17/28]
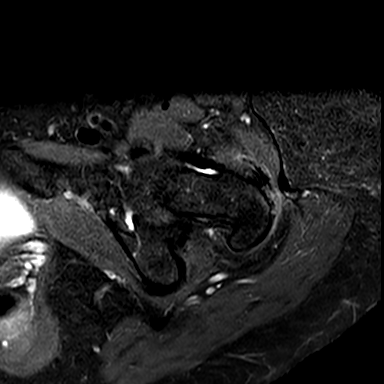

[25 of 48 positions shown; findings below may reference images not displayed]

IMPRESSION: 1.  Low-grade partial tearing of the anterior left abductor cuff with mild 
peritendinous edema. 
2.  Lipoma within the left gluteus minimus. 
3.  Degenerative changes including mild involvement of the hips.

## 2021-12-30 IMAGING — MR MRI LUMBAR SPINE WITHOUT CONTRAST
4 of 6 series · 15 of 48 positions shown · IV contrast (gadolinium)
Comparison: CT of the abdomen and pelvis from November 30, 2021.

________________________________________________________________________________________________ 
MRI LUMBAR SPINE WITHOUT CONTRAST, 12/30/2021 [DATE]: 
CLINICAL INDICATION: Bilateral groin pain. Left side worse. Left thigh pain off 
and on.
TECHNIQUE: Multiplanar, multiecho position MR images of the lumbar spine were 
performed without intravenous gadolinium enhancement. Patient was scanned on a 
1.5T magnet.

[Series 201: survey · axial · 10.0mm · 1.39mm/px · z∈[+91,+308]mm · 4 of 9 slices shown]
[im 1/9]
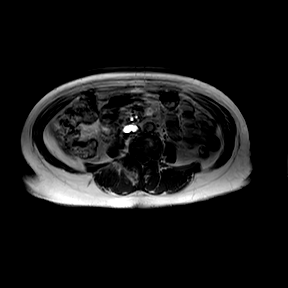
[im 3/9]
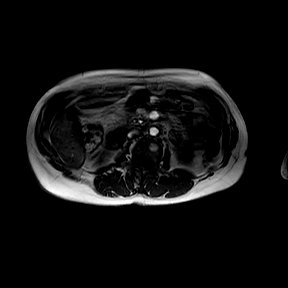
[im 6/9]
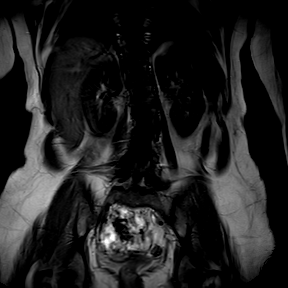
[im 9/9]
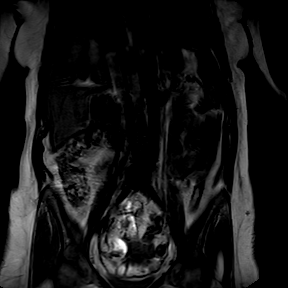

[Series 301: t2w_cor-surv · coronal · 6.0mm · 0.60mm/px · 2 of 5 slices shown]
[im 1/5]
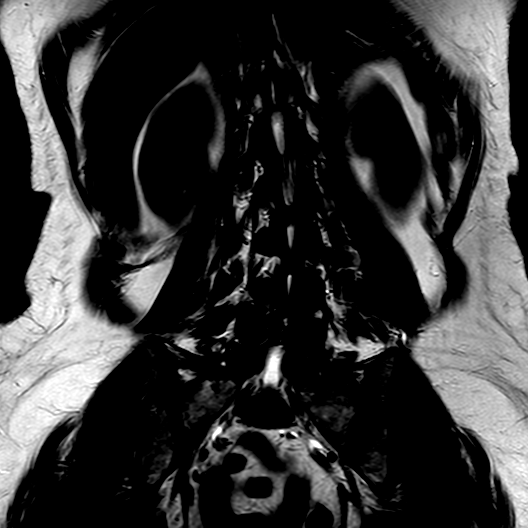
[im 5/5]
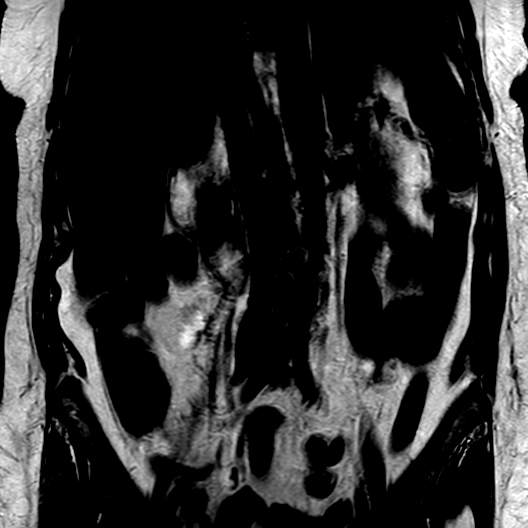

[Series 401: t1_tse_sag · sagittal · 4.0mm · 0.33mm/px · 6 of 17 slices shown]
[im 1/17]
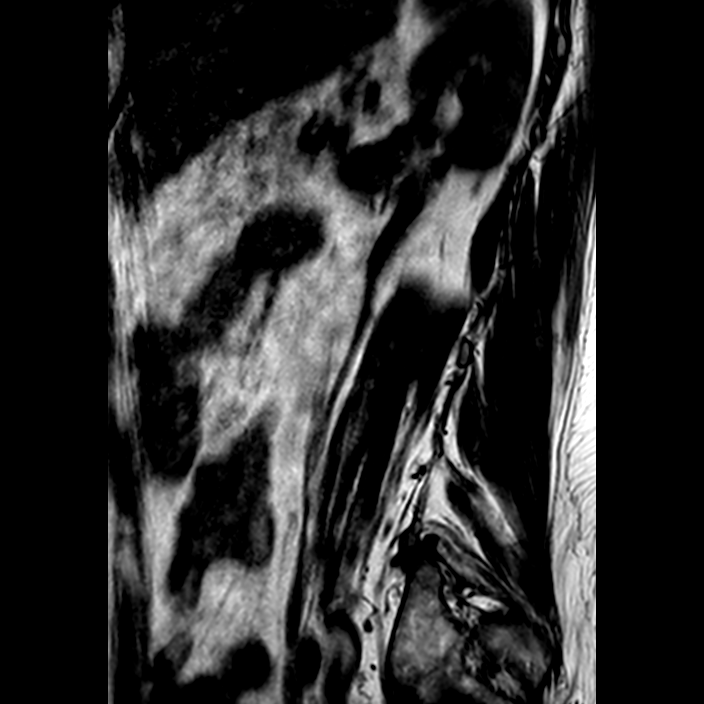
[im 3/17]
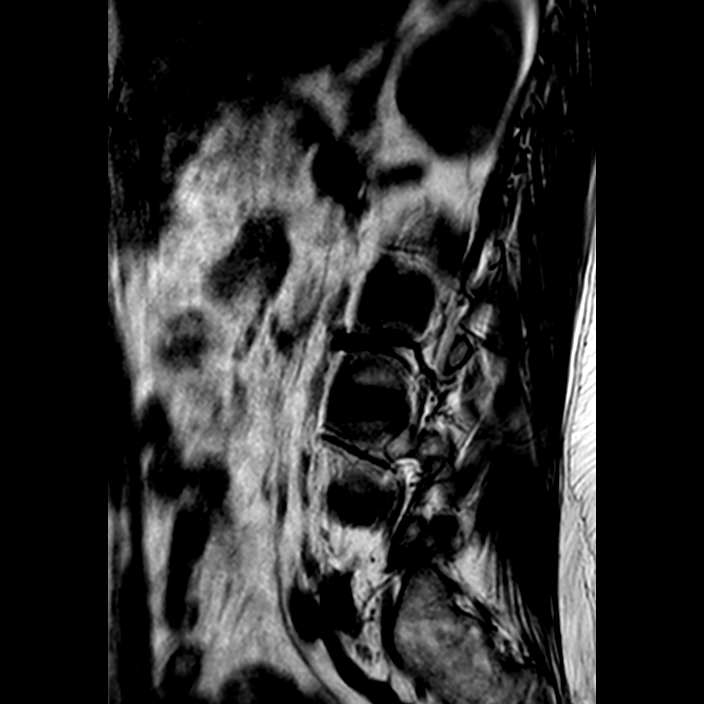
[im 5/17]
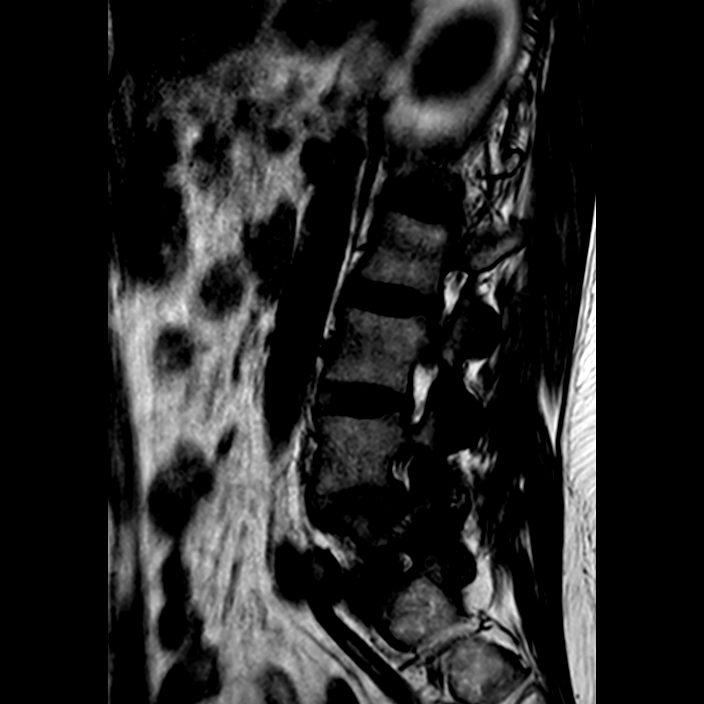
[im 7/17]
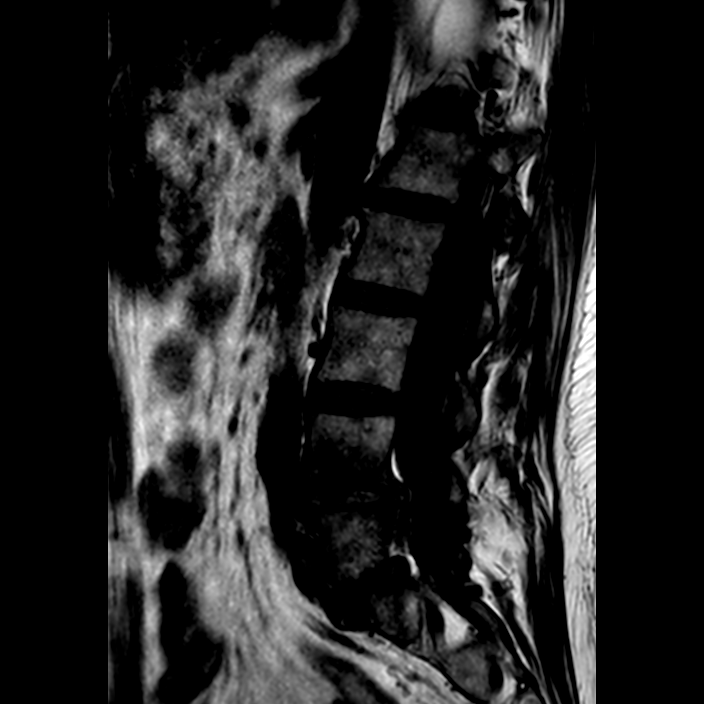
[im 9/17]
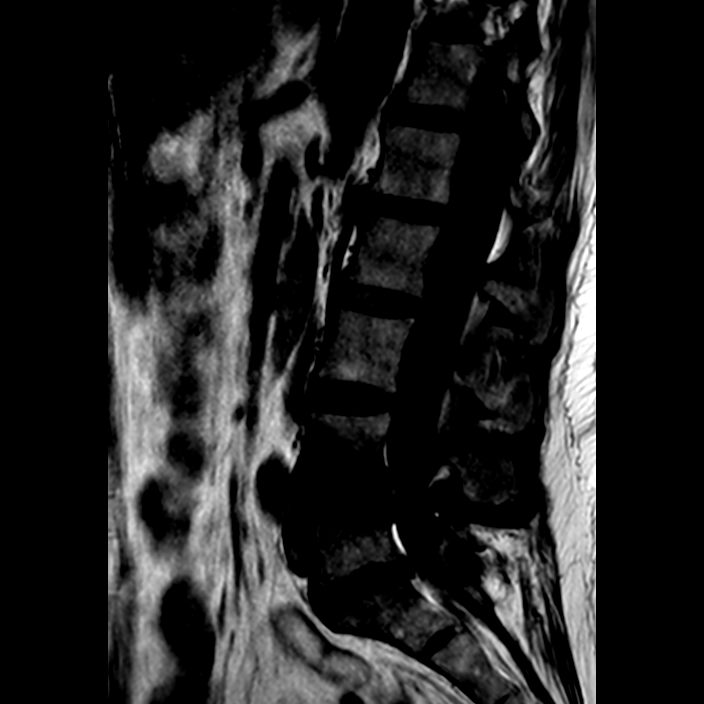
[im 15/17]
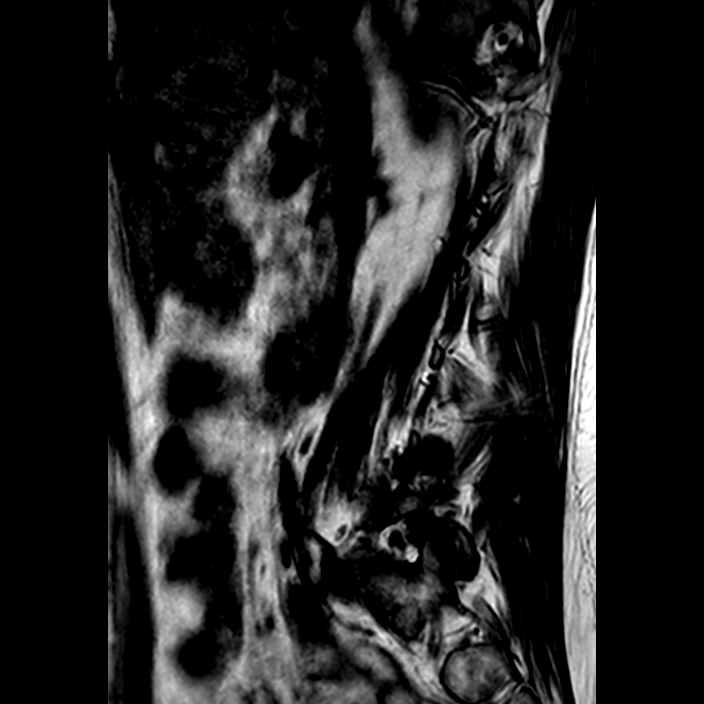

[Series 501: t2_tse_sag · sagittal · 4.0mm · 0.41mm/px · 3 of 17 slices shown]
[im 3/17]
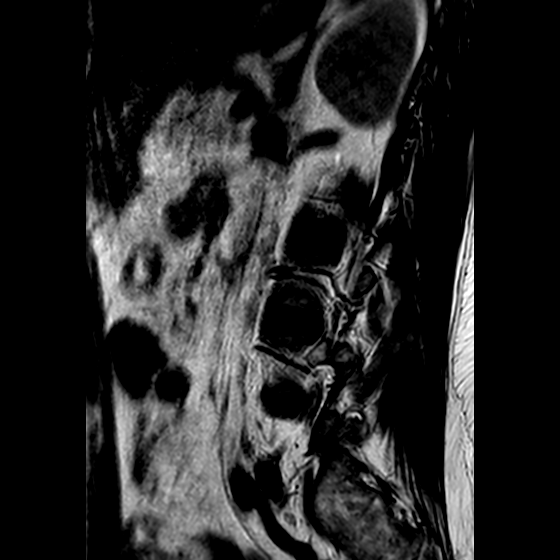
[im 9/17]
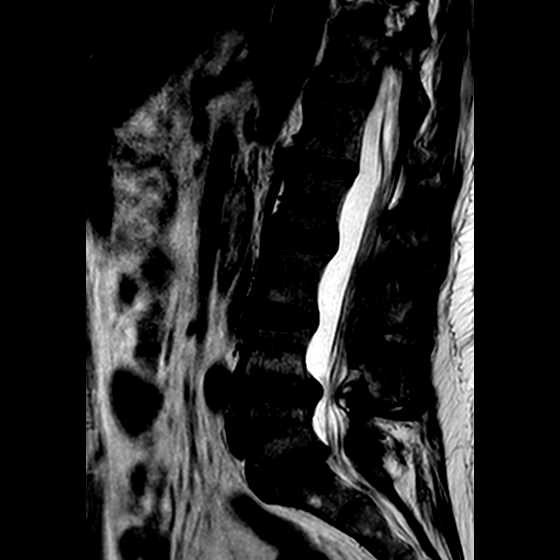
[im 15/17]
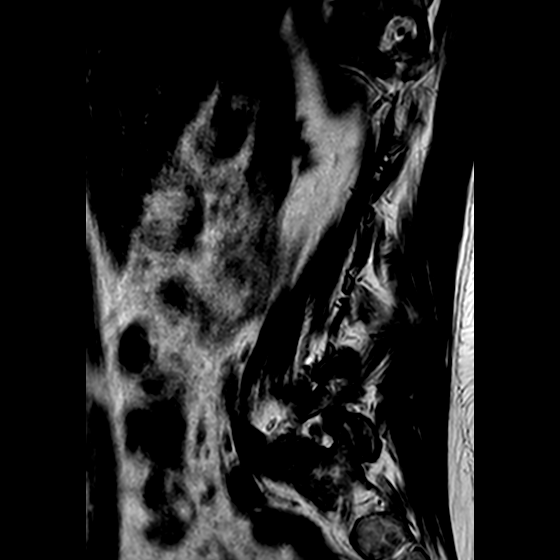

[15 of 48 positions shown; findings below may reference images not displayed]

FINDINGS: --------------------------------------------------------------------------- 
General: 
Leftward curvature of the lumbar spine. Minimal retrolisthesis of L1 on L2. 
Minimal anterolisthesis of L4 on L5. No focal suspect marrow signal throughout. 
Active endplate change at the L4-L5 level towards the right side. Mild 
left-sided active endplate change at L5-S1. Conus medullaris is normal in size 
and signal intensity, terminating at L2 superior endplate level. Visualized 
extraspinal soft tissues are unremarkable. Partially visualized lipoma along the 
left hip. 
Modic I-II levels: L4-L5, L5-S1. 
Ligamentum flavum > 2.5 mm levels: All levels. 
--------------------------------------------------------------------------- 
Segmental: 
T12-L1: No significant central canal or neural foraminal narrowing. 
L1-L2: Trace disc bulge. No significant central canal or neural foraminal 
narrowing.  
L2-L3: Trace disc bulge. No significant central canal or neural foraminal 
narrowing.  
L3-L4: No significant central canal or neural foraminal narrowing. 
L4-L5: Loss of disc height. Disc bulge. Right foraminal shallow disc herniation. 
Fluid and bilateral facet joints. Synovial cysts extending into the posterior 
epidural fat. Overall moderate central canal narrowing with associated bilateral 
subarticular recess narrowing. Moderate to severe right neural foraminal 
narrowing. Moderate left neural foraminal narrowing. 
L5-S1: Loss of disc height. Disc bulge. Bilateral facet hypertrophy. Mild fluid 
in bilateral facet joints. No significant central canal narrowing. No 
significant right neural foraminal narrowing. Moderate left neural foraminal 
narrowing. 
---------------------------------------------------------------------------
IMPRESSION: Discogenic/degenerative changes as above. 
Worst level(s) of central canal/subarticular recess narrowing: L4-L5 
Worst level(s) of neural foraminal narrowing: L4-L5

## 2021-12-30 IMAGING — DX HIP 2 VIEWS RIGHT WITH PELVIS
3 series · 3 of 3 positions shown · non-contrast
Comparison: None prior.

________________________________________________________________________________________________ 
HIP 2 VIEWS RIGHT WITH PELVIS, 12/30/2021 [DATE]: 
CLINICAL INDICATION: Lumbar radiculopathy. Right groin pain for one year.

[AP (1 of 2)]
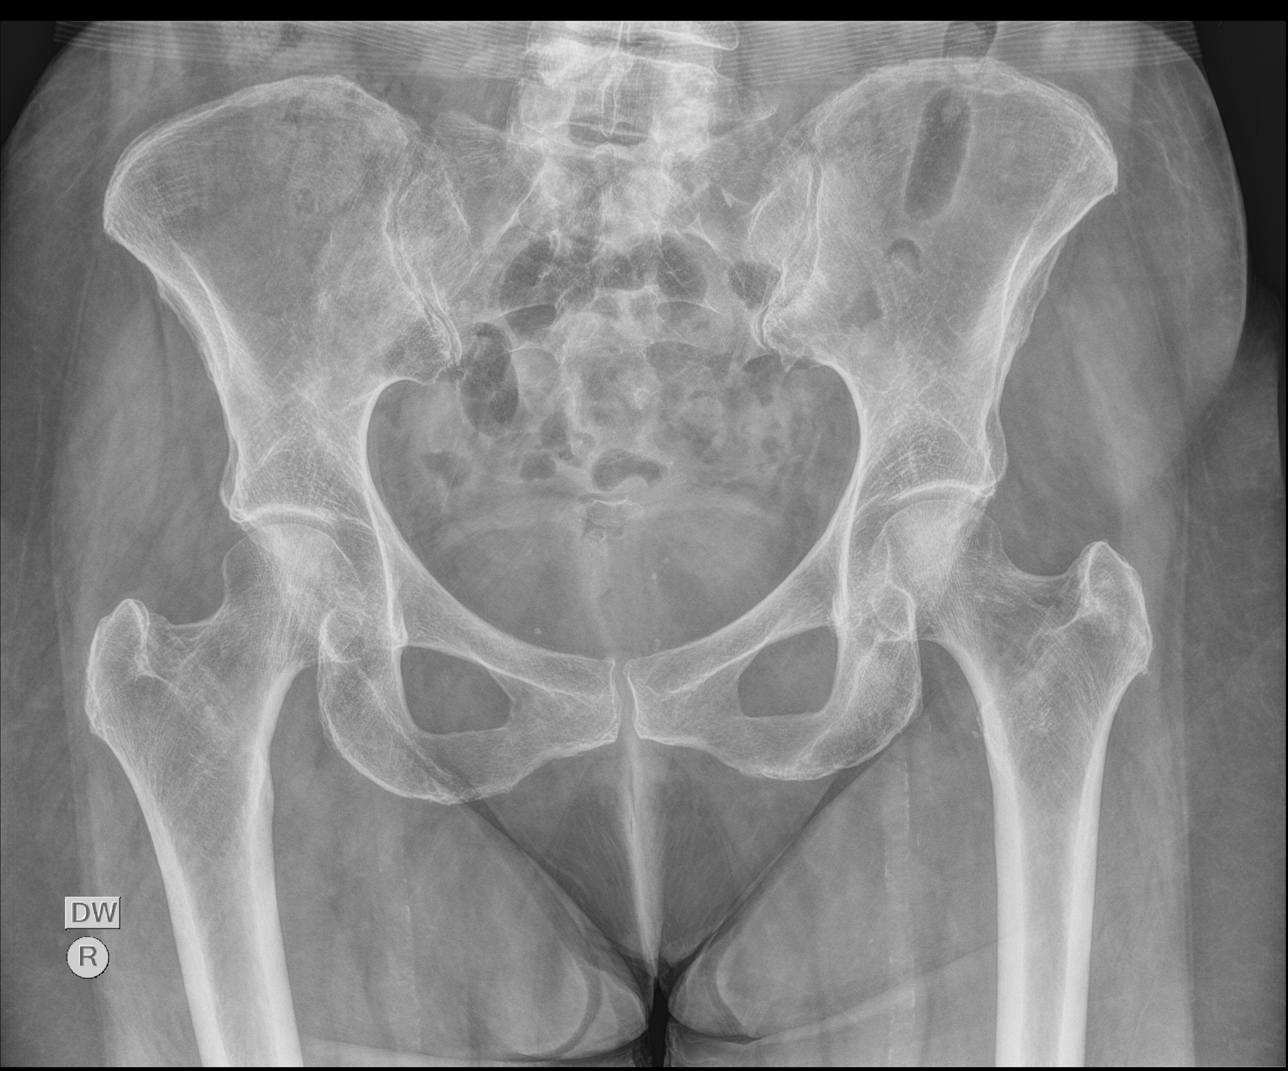

[AP (2 of 2)]
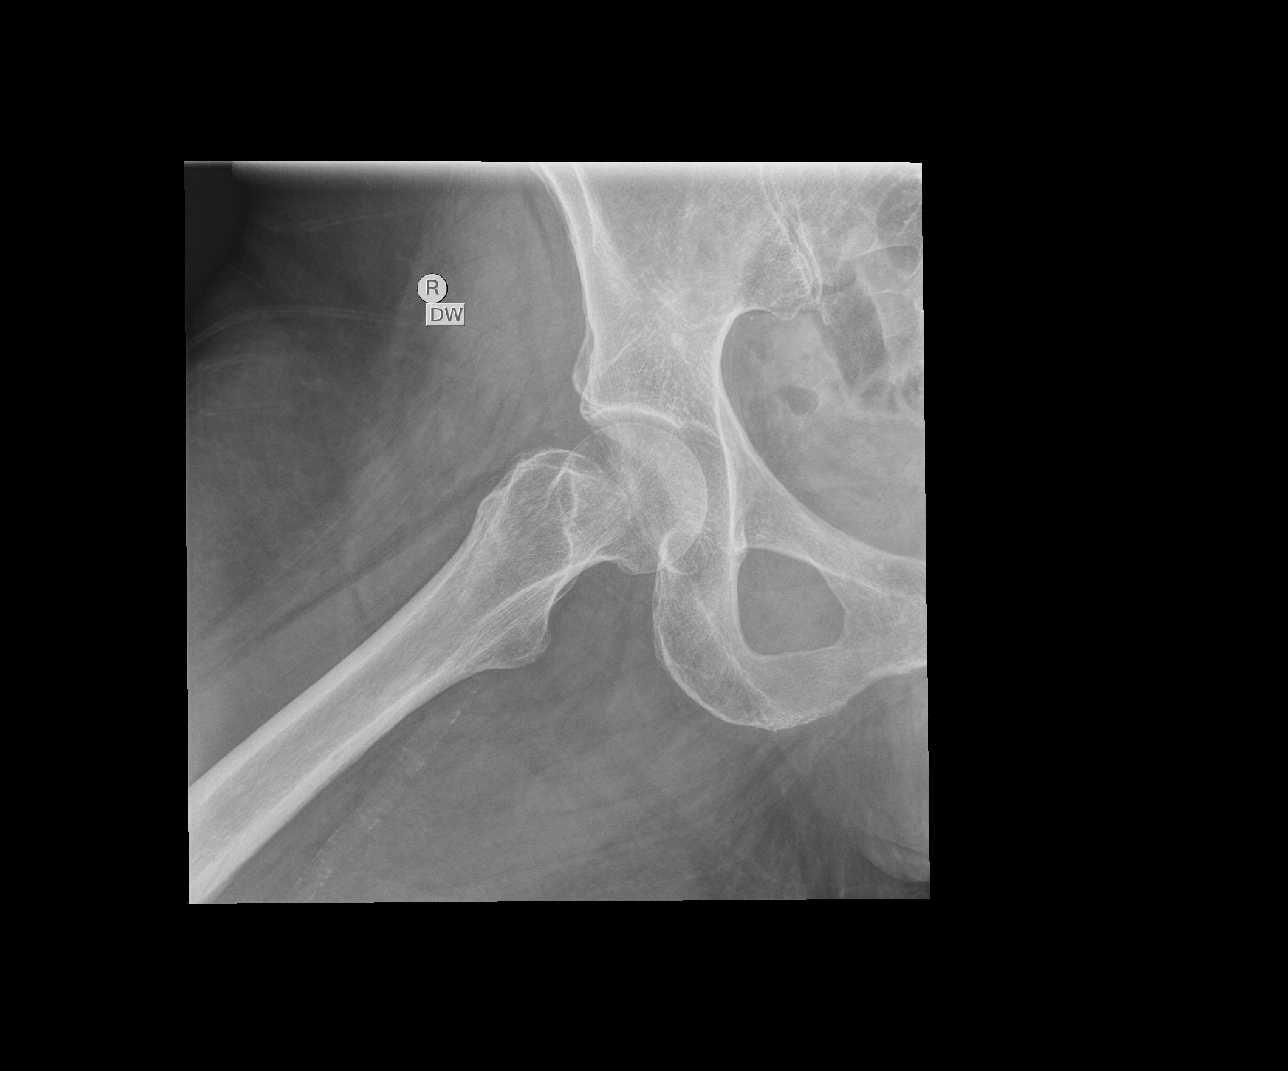

[lateral]
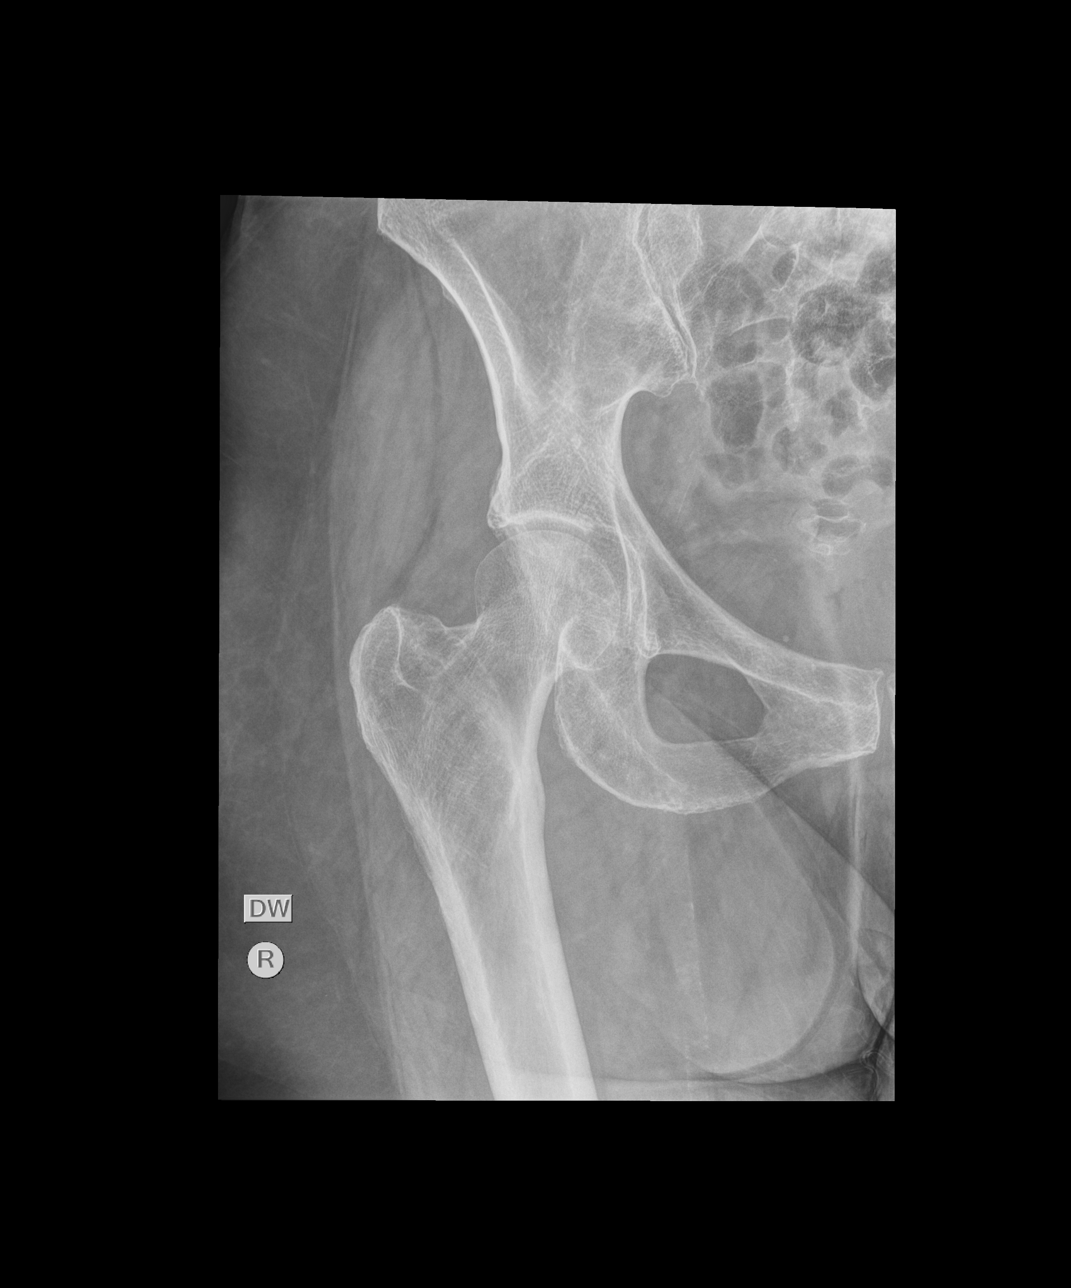

[3 of 3 positions shown; findings below may reference images not displayed]

FINDINGS: No fracture. Normal alignment. Mild hip joint spaces are intact. 
Degenerative changes lower lumbar spine and SI joints well osteophytic spurring. 
Scattered vascular calcifications.
IMPRESSION: Degenerative changes including the hips. Please see MR exam the same day.

## 2022-02-20 IMAGING — MR MRI CERVICAL SPINE WITHOUT CONTRAST
4 of 6 series · 20 of 48 positions shown · IV contrast (gadolinium)
Comparison: None.

________________________________________________________________________________________________ 
MRI CERVICAL SPINE WITHOUT CONTRAST, 02/20/2022 [DATE]: 
CLINICAL INDICATION: m85.10 cervical radiculopathy. Bilateral shoulder and arm 
pain.
TECHNIQUE: Multiplanar, multiecho position MR images of the cervical spine were 
performed without intravenous gadolinium enhancement. Patient was scanned on a 
1.5T magnet.

[Series 101: survey* · axial · 10.0mm · 1.56mm/px · z∈[-30,+199]mm · 8 of 15 slices shown]
[im 1/15]
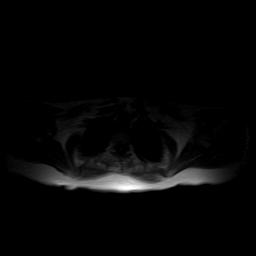
[im 3/15]
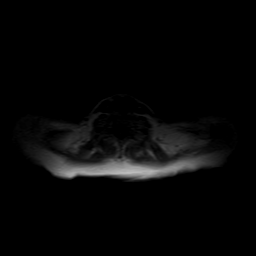
[im 5/15]
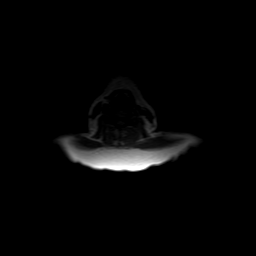
[im 7/15]
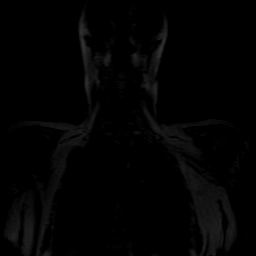
[im 9/15]
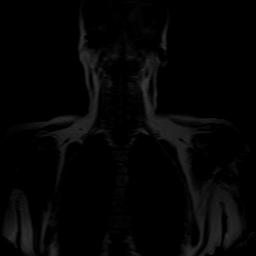
[im 11/15]
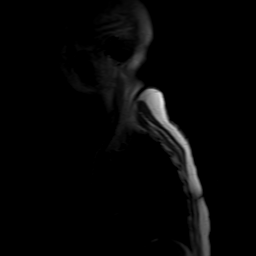
[im 13/15]
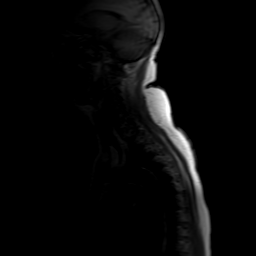
[im 15/15]
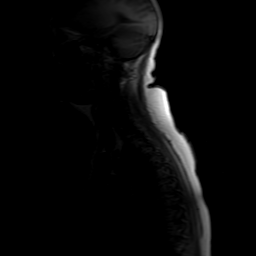

[Series 201: t2w_cor-surv · coronal · 5.0mm · 0.85mm/px · 3 of 7 slices shown]
[im 1/7]
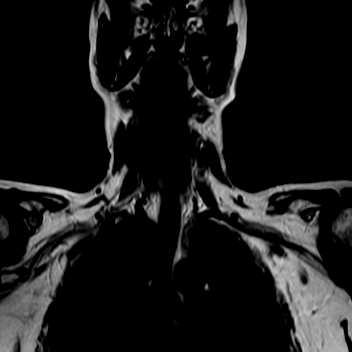
[im 4/7]
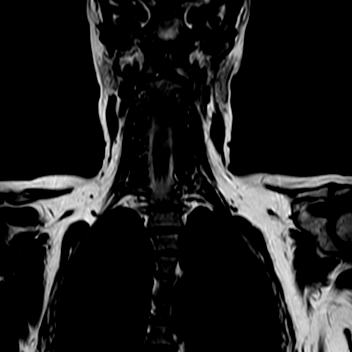
[im 7/7]
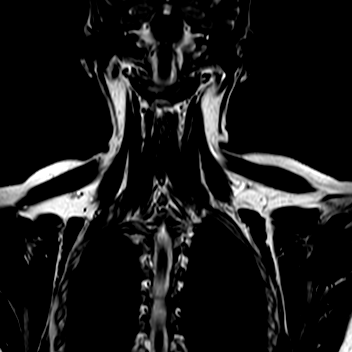

[Series 301: t1_sag · sagittal · 3.0mm · 0.36mm/px · 6 of 15 slices shown]
[im 1/15]
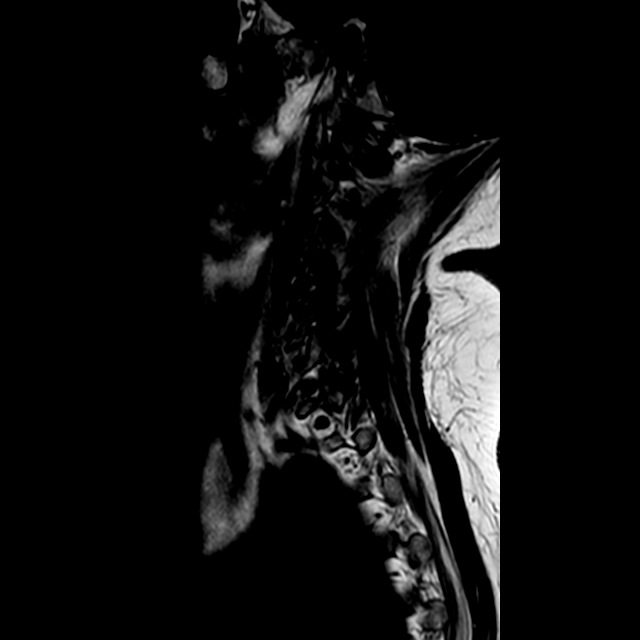
[im 3/15]
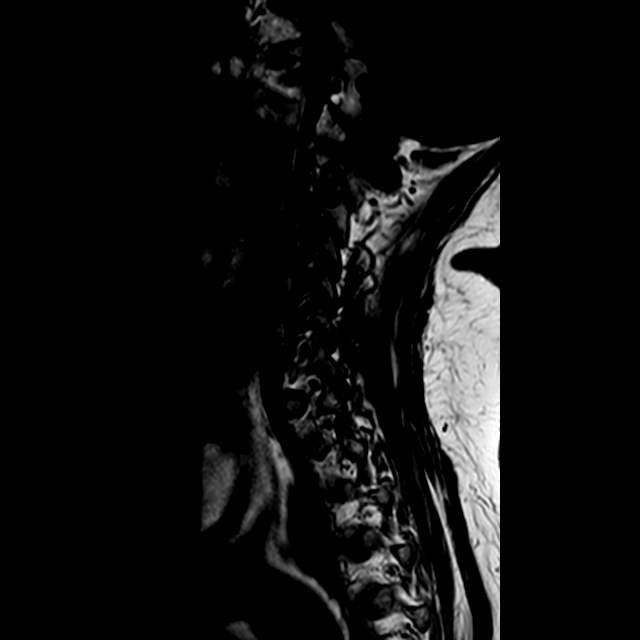
[im 5/15]
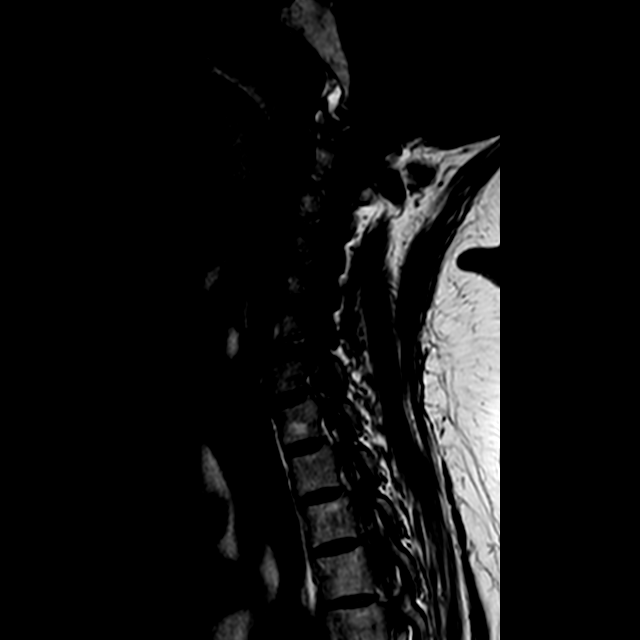
[im 8/15]
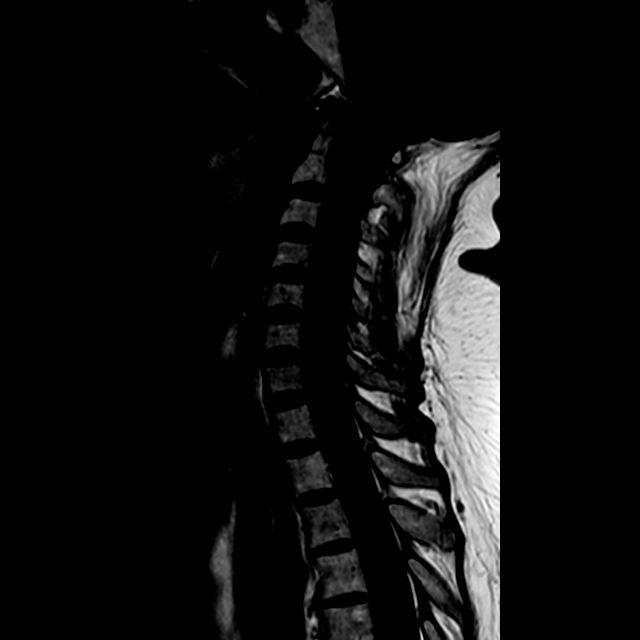
[im 10/15]
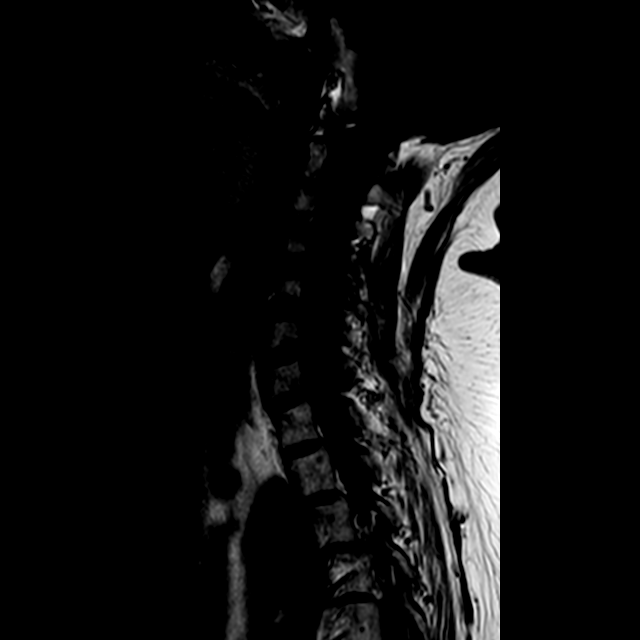
[im 12/15]
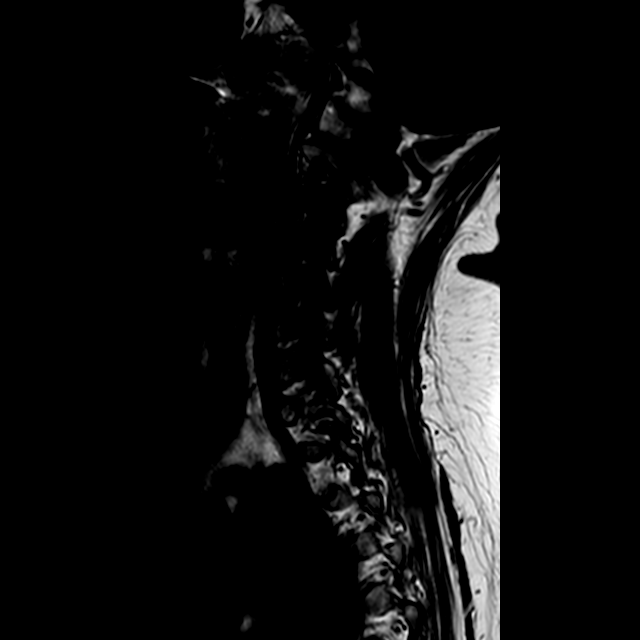

[Series 401: t2w_mv_xd_sag · sagittal · 3.0mm · 0.31mm/px · 3 of 15 slices shown]
[im 3/15]
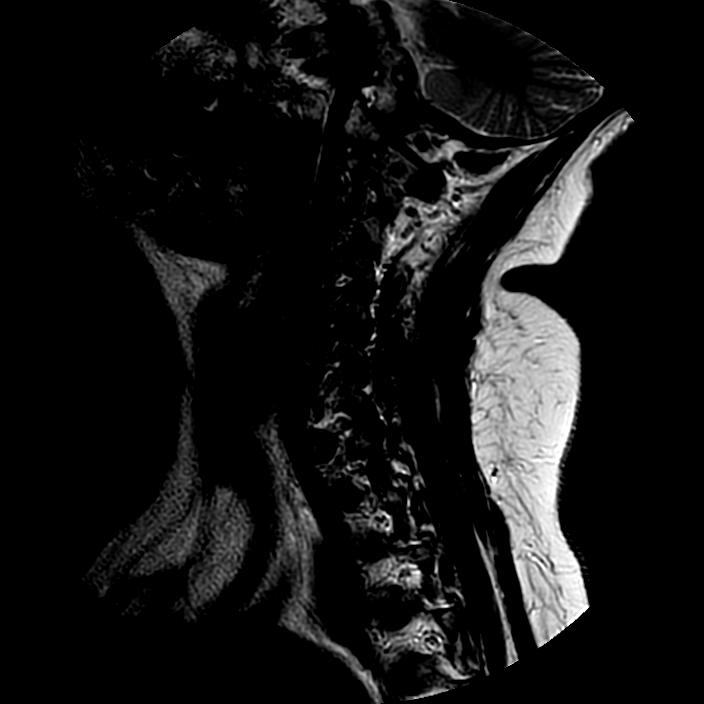
[im 8/15]
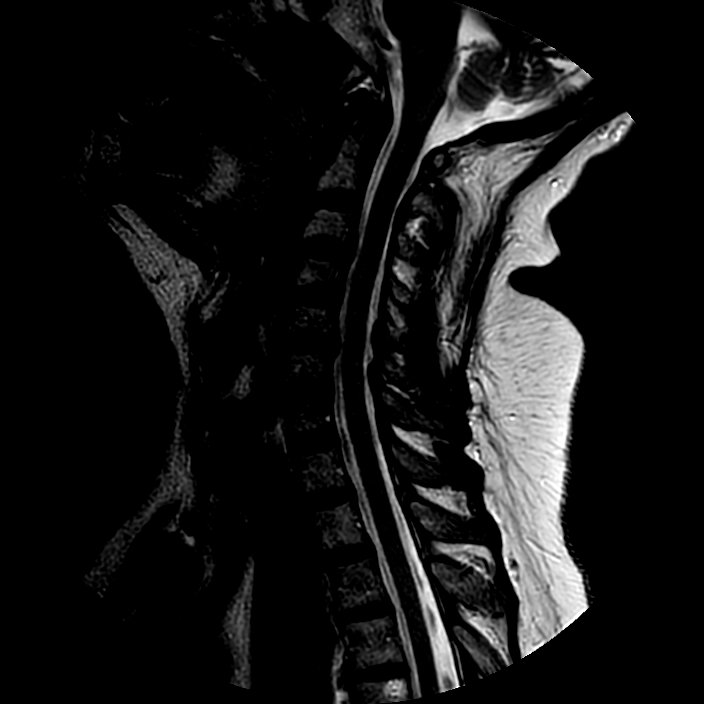
[im 12/15]
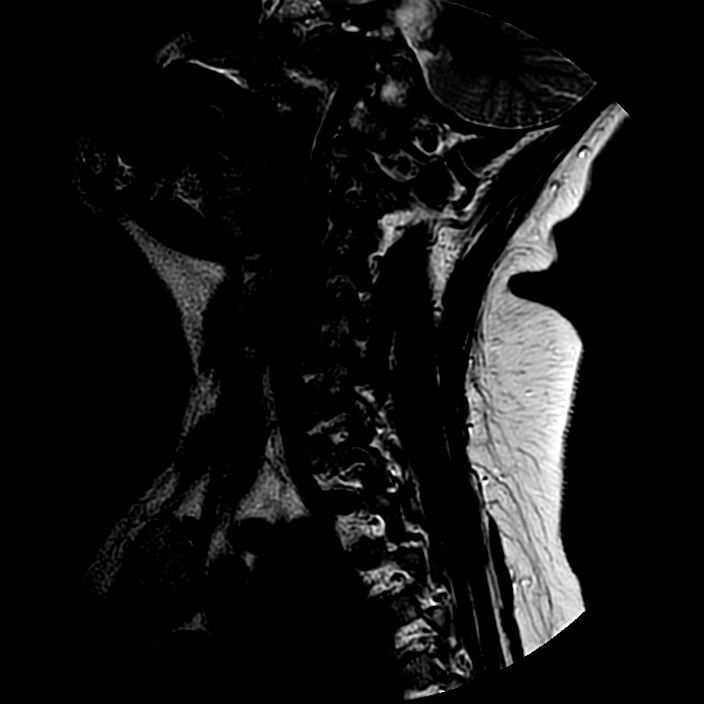

[20 of 48 positions shown; findings below may reference images not displayed]

FINDINGS: -------------------------------------------------------------------------------- 
----------------- 
GENERAL: 
ALIGNMENT: Normal. 
VERTEBRAL BODY HEIGHT: Normal.  
MARROW SIGNAL: No focal suspect signal abnormality. 
CORD SIGNAL: Normal.  
ADDITIONAL FINDINGS: None. 
-------------------------------------------------------------------------------- 
---------------- 
SEGMENTAL: 
CRANIOCERVICAL JUNCTION: No significant stenosis. 
C2-C3: No significant central canal or neural foraminal narrowing. 
C3-C4: Central disc herniation with mild central canal narrowing. No significant 
neural foraminal narrowing. 
C4-C5: No significant central canal or neural foraminal narrowing. 
C5-C6: Disc bulge eccentric to the left side. Left uncovertebral joint 
hypertrophy. Mild left-sided central canal narrowing with mild deformity of the 
left cord. Mild left neural foraminal narrowing. No significant right neural 
foraminal narrowing. 
C6-C7: No significant central canal or neural foraminal narrowing. 
C7-T1: No significant central canal or neural foraminal narrowing. 
-------------------------------------------------------------------------------- 
---------------
IMPRESSION: 1.  Discogenic/degenerative changes as above. 
2.  Worst level(s) of central canal narrowing: C5-C6 where there is mild 
deformity of the left cord. 
3.  No moderate or severe neural foraminal narrowing at any level.
